# Patient Record
Sex: Female | Born: 1985 | Race: White | Hispanic: No | Marital: Married | Smoking: Never smoker
Health system: Southern US, Community
[De-identification: ages and names within clinical notes are randomized; demographics above are authoritative.]

## PROBLEM LIST (undated history)

## (undated) DIAGNOSIS — K8020 Calculus of gallbladder without cholecystitis without obstruction: Secondary | ICD-10-CM

## (undated) DIAGNOSIS — E88819 Insulin resistance, unspecified: Secondary | ICD-10-CM

## (undated) DIAGNOSIS — F509 Eating disorder, unspecified: Secondary | ICD-10-CM

## (undated) DIAGNOSIS — F329 Major depressive disorder, single episode, unspecified: Secondary | ICD-10-CM

## (undated) DIAGNOSIS — F32A Depression, unspecified: Secondary | ICD-10-CM

## (undated) HISTORY — DX: Depression, unspecified: F32.A

## (undated) HISTORY — DX: Major depressive disorder, single episode, unspecified: F32.9

## (undated) HISTORY — DX: Insulin resistance, unspecified: E88.819

## (undated) HISTORY — DX: Eating disorder, unspecified: F50.9

## (undated) HISTORY — DX: Calculus of gallbladder without cholecystitis without obstruction: K80.20

---
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
## 2010-10-30 HISTORY — PX: CHOLECYSTECTOMY: SHX55

## 2011-11-17 ENCOUNTER — Ambulatory Visit: Payer: Self-pay | Admitting: Internal Medicine

## 2011-12-01 ENCOUNTER — Ambulatory Visit: Payer: Self-pay | Admitting: General Surgery

## 2011-12-01 LAB — CBC WITH DIFFERENTIAL/PLATELET
Basophil #: 0 10*3/uL (ref 0.0–0.1)
Basophil %: 0.5 %
Eosinophil #: 0.1 10*3/uL (ref 0.0–0.7)
Eosinophil %: 2.2 %
HGB: 13 g/dL (ref 12.0–16.0)
Lymphocyte %: 31.7 %
MCHC: 34 g/dL (ref 32.0–36.0)
MCV: 92 fL (ref 80–100)
Monocyte %: 8.1 %
Neutrophil #: 2.7 10*3/uL (ref 1.4–6.5)
Neutrophil %: 57.5 %
RBC: 4.18 10*6/uL (ref 3.80–5.20)
RDW: 12.7 % (ref 11.5–14.5)
WBC: 4.7 10*3/uL (ref 3.6–11.0)

## 2011-12-01 LAB — HEPATIC FUNCTION PANEL A (ARMC)
SGOT(AST): 27 U/L (ref 15–37)
SGPT (ALT): 34 U/L

## 2011-12-05 ENCOUNTER — Ambulatory Visit: Payer: Self-pay | Admitting: General Surgery

## 2015-02-21 NOTE — Op Note (Signed)
PATIENT NAME:  Katie Thomas, Katie Thomas MR#:  [id number] DATE OF BIRTH:  17-Aug-1986  DATE OF PROCEDURE:  12/05/2011  PREOPERATIVE DIAGNOSIS: Cholelithiasis, chronic cholecystitis.   POSTOPERATIVE DIAGNOSIS: Cholelithiasis, chronic cholecystitis.   OPERATION: Laparoscopy, cholecystectomy with cholangiogram.   SURGEON: Kathreen CosierS. G. Sankar, MD    ANESTHESIA: General.   COMPLICATIONS: None.   ESTIMATED BLOOD LOSS: Less than 25 mL.   DRAINS: None.   PROCEDURE: The patient was put to sleep in the supine position on the operating table. The abdomen was prepped and draped out as a sterile field. A small incision was made in the upper lip of the umbilicus and the fascia was pulled up and the Veress needle with the InnerDyne sleeve was positioned in the peritoneal cavity and verified with the hanging drop method. Pneumoperitoneum was obtained and a 10 mm port was placed. Camera was introduced with good visualization of the peritoneal cavity. Epigastric and two lateral 5 mm ports were placed. The liver appeared normal and the adjoining bowel and transverse colon, duodenum, stomach were all noted to be normal. There were adhesions surrounding the lower posterior half of the gallbladder going towards the Hartmann's pouch. With careful exposure, the adhesions were taken down to reveal the Hartmann's pouch in the cystic duct area. Further dissection was performed and the cystic duct was isolated. A Kumar clamp and catheter were positioned. Cholangiogram was performed which showed preferential filling of the gallbladder with evidence of tiny stones occupying the cystic duct which were obstructing and did not allow the dilator to get into the common bile duct. In view of this, the Kumar catheter was used to decompress the gallbladder. It was then removed. The cystic duct was proximally hemoclipped and a small opening was made. The cystic duct was then milked upward from the common duct area with multiple small stones removed.  Following this, there appeared to be free flow of bile coming through. A Reddick catheter was then utilized and cholangiogram was performed which showed normal  sized common bile duct and proximal radicles. No definite filling defects were identified and there did not appear to be any obstruction to flow. The catheter was removed. The cystic duct was hemoclipped and cut. The cystic artery was identified, freed, hemoclipped, and cut. The gallbladder was dissected free from its bed using cautery for control of bleeding. A small amount of fluid was used to irrigate out the right upper quadrant and all fluid suctioned out. A 5 mm scope was then utilized through the epigastric port site. The gallbladder was placed in an EndoCatch device and brought out through the umbilical port site. It was subsequently opened showing multiple tiny stones of 2 to 3 mm size. The fascial opening in the umbilicus appeared to be relatively small. It was really hard to feel and was, therefore, left alone. The remaining ports were removed after release of pneumoperitoneum. The skin incisions were closed with subcuticular 4-0 Vicryl reinforced with Steri-Strips. Dry sterile dressing was placed. The patient tolerated the procedure well. No immediate problems were encountered. She was subsequently extubated and returned to the recovery room in stable condition.   ____________________________ S.Wynona LunaG. Sankar, MD sgs:drc D: 12/05/2011 09:36:41 ET T: 12/05/2011 10:28:32 ET JOB#: [id number]  cc: Timoteo ExposeS.G. Evette CristalSankar, MD, <Dictator> [facility] Wynona LunaG SANKAR MD ELECTRONICALLY SIGNED 12/05/2011 13:51

## 2016-02-16 ENCOUNTER — Encounter: Payer: Self-pay | Admitting: Family Medicine

## 2016-02-16 ENCOUNTER — Ambulatory Visit (INDEPENDENT_AMBULATORY_CARE_PROVIDER_SITE_OTHER): Payer: BLUE CROSS/BLUE SHIELD | Admitting: Family Medicine

## 2016-02-16 VITALS — BP 112/74 | HR 88 | Temp 98.7°F | Ht 65.0 in | Wt 182.8 lb

## 2016-02-16 DIAGNOSIS — F329 Major depressive disorder, single episode, unspecified: Secondary | ICD-10-CM

## 2016-02-16 DIAGNOSIS — M546 Pain in thoracic spine: Secondary | ICD-10-CM | POA: Diagnosis not present

## 2016-02-16 DIAGNOSIS — R1030 Lower abdominal pain, unspecified: Secondary | ICD-10-CM

## 2016-02-16 DIAGNOSIS — F419 Anxiety disorder, unspecified: Secondary | ICD-10-CM

## 2016-02-16 DIAGNOSIS — F32A Depression, unspecified: Secondary | ICD-10-CM

## 2016-02-16 DIAGNOSIS — F418 Other specified anxiety disorders: Secondary | ICD-10-CM

## 2016-02-16 NOTE — Progress Notes (Signed)
Patient ID: Katie Thomas, female   DOB: [birth year], 30 y.o.   MRN: [id number]  Marikay Alar, MD Phone: [phone]  Katie Thomas is a 30 y.o. female who presents today for new patient visit.  Depression and anxiety: Patient notes in the past she's had depression and anxiety. Seems seasonal. Had been on medication though has been off of this for some time. Right now she is fine if she does not think about the depression and anxiety. Her anxiety is described as feeling on edge and wanting to scream or break something. She does note occasional thoughts of being better off not being alive though no intent or plan to harm herself. No prior attempt to harm herself. No HI.  Abdominal pain: She is followed by gynecology for this. She notes one time a month she develops a sudden intense sharp discomfort in her pelvis. This is not typically around her menstrual cycle. She does note some dysuria and urinary frequency and urgency intermittently as well although nothing consistent. None at this time. No diarrhea, vomiting, or nausea. Has had pregnancy test to evaluate this previously that was most recently negative. She had an IUD placed in January. She has periods every month. Notes the discomfort has been going on intermittently for the last 1-2 years. She's not had any abdominal pain since last month.  Thoracic back pain: Patient notes this has been occurring in her thoracic back in the area of the rhomboids at the right edge of her shoulder blade. Notes this only occurs after she has carried something for at least 30 minutes. Has been going on for the last 1.5 years. No radiation to her arms. No numbness or weakness. She has tried some exercises and Advil that have been some benefit. It has been several weeks since she last had back pain.  Active Ambulatory Problems    Diagnosis Date Noted  . Thoracic back pain 02/19/2016  . Anxiety and depression 02/19/2016  . Abdominal pain 02/19/2016   Resolved  Ambulatory Problems    Diagnosis Date Noted  . No Resolved Ambulatory Problems   Past Medical History  Diagnosis Date  . Depression   . Eating disorder   . Gallstones     Family History  Problem Relation Age of Onset  . Cancer    . Breast cancer    . Heart disease    . Mental illness    . Diabetes      Social History   Social History  . Marital Status: Single    Spouse Name: N/A  . Number of Children: N/A  . Years of Education: N/A   Occupational History  . Not on file.   Social History Main Topics  . Smoking status: Never Smoker   . Smokeless tobacco: Not on file  . Alcohol Use: 0.0 oz/week    0 Standard drinks or equivalent per week     Comment: 1 drink a month  . Drug Use: No  . Sexual Activity: Not on file   Other Topics Concern  . Not on file   Social History Narrative    ROS  General:  Negative for nexplained weight loss, fever Skin: Negative for new or changing mole, sore that won't heal HEENT: Negative for trouble hearing, trouble seeing, ringing in ears, mouth sores, hoarseness, change in voice, dysphagia. CV:  Negative for chest pain, dyspnea, edema, palpitations Resp: Negative for cough, dyspnea, hemoptysis GI: Positive for abdominal pain, Negative for nausea, vomiting, diarrhea, constipation, melena, hematochezia. GU:  Negative for dysuria, incontinence, urinary hesitance, hematuria, vaginal or penile discharge, polyuria, sexual difficulty, lumps in testicle or breasts MSK: Positive for muscle cramps or aches, negative for joint pain or swelling Neuro: Negative for headaches, weakness, numbness, dizziness, passing out/fainting Psych: Positive for depression, anxiety, memory problems  Objective  Physical Exam Filed Vitals:   02/16/16 0933  BP: 112/74  Pulse: 88  Temp: 98.7 F (37.1 C)    BP Readings from Last 3 Encounters:  02/16/16 112/74   Wt Readings from Last 3 Encounters:  02/16/16 182 lb 12.8 oz (82.918 kg)    Physical Exam    Constitutional: She is well-developed, well-nourished, and in no distress.  HENT:  Head: Normocephalic and atraumatic.  Right Ear: External ear normal.  Left Ear: External ear normal.  Mouth/Throat: Oropharynx is clear and moist. No oropharyngeal exudate.  Eyes: Conjunctivae are normal. Pupils are equal, round, and reactive to light.  Neck: Neck supple.  Cardiovascular: Normal rate, regular rhythm and normal heart sounds.  Exam reveals no gallop and no friction rub.   No murmur heard. Pulmonary/Chest: Effort normal and breath sounds normal. No respiratory distress. She has no wheezes. She has no rales.  Abdominal: Soft. Bowel sounds are normal. She exhibits no distension. There is no tenderness. There is no rebound and no guarding.  Musculoskeletal: She exhibits no edema.  No midline spine tenderness, no midline spine step-off, no muscular back tenderness, no muscular back swelling  Lymphadenopathy:    She has no cervical adenopathy.  Neurological: She is alert. Gait normal.  5/5 strength in bilateral biceps, triceps, grip, quads, hamstrings, plantar and dorsiflexion, sensation to light touch intact in bilateral UE and LE, normal gait, 2+ patellar reflexes  Skin: Skin is warm and dry. She is not diaphoretic.  Psychiatric:  Mood depressed and anxious, affect mildly anxious     Assessment/Plan:   Thoracic back pain Suspect possible strain of rhomboids leading to discomfort. Less likely felt to be a spinal column issue. She is neurologically intact in her upper and lower extremities. Discussed heat over the area of discomfort and ice when this occurs. Discussed anti-inflammatories. Advised on back exercises. She will monitor. If persists would consider further evaluation with imaging. Given return precautions.  Anxiety and depression Patient with symptoms of anxiety and depression. She does have some SI though has no intent or plan to harm herself. Discussed potential treatment methods  including medications and therapy. Patient wanted to hold off on medications at this time. Referral to psychology placed. Given return precautions.  Abdominal pain Patient with chronic intermittent lower abdominal discomfort that has been followed by gynecology to this point. Not associated with anything in particular. She has no pain at this time. She has a benign abdominal exam. Potentially could be related to ovulation. Discussed that she would likely benefit from pelvic ultrasound and abdominal ultrasound to evaluate further. She noted she would like to discuss this with her gynecologist first. Offered pregnancy test the patient declined at this time. She will monitor and try to know when this occurs every month. We'll continue to monitor. She is given return precautions.    Orders Placed This Encounter  Procedures  . Ambulatory referral to Psychology    Referral Priority:  Routine    Referral Type:  Psychiatric    Referral Reason:  Specialty Services Required    Requested Specialty:  Psychology    Number of Visits Requested:  1    Marikay AlarEric Cadence Haslam, MD [facility]  [employer]

## 2016-02-16 NOTE — Patient Instructions (Addendum)
Nice to meet you. We're going to refer you to a therapist. If you do not hear back about this referral in the next week please let us know. Please follow-up with her gynecologist regarding her pelvic discomfort. Your back discomfort is likely related to muscular strain given the location. Please do the exercises listed below for your back.  you can also use ibuprofen over-the-counter. if you have worsening or persistent pain please let us know so we can follow-up on this. If you develop persistent abdominal pain, diarrhea, nausea, vomiting, burning with urination, worsening depression or anxiety, thoughts of harming herself or others and worsening back pain, fevers, numbness, weakness, loss of bowel or bladder function, numbness between her legs, or any new or changing symptoms please seek medical attention.  Back Exercises If you have pain in your back, do these exercises 2-3 times each day or as told by your doctor. When the pain goes away, do the exercises once each day, but repeat the steps more times for each exercise (do more repetitions). If you do not have pain in your back, do these exercises once each day or as told by your doctor. EXERCISES Single Knee to Chest Do these steps 3-5 times in a row for each leg: 1. Lie on your back on a firm bed or the floor with your legs stretched out. 2. Bring one knee to your chest. 3. Hold your knee to your chest by grabbing your knee or thigh. 4. Pull on your knee until you feel a gentle stretch in your lower back. 5. Keep doing the stretch for 10-30 seconds. 6. Slowly let go of your leg and straighten it. Pelvic Tilt Do these steps 5-10 times in a row: 1. Lie on your back on a firm bed or the floor with your legs stretched out. 2. Bend your knees so they point up to the ceiling. Your feet should be flat on the floor. 3. Tighten your lower belly (abdomen) muscles to press your lower back against the floor. This will make your tailbone point up to  the ceiling instead of pointing down to your feet or the floor. 4. Stay in this position for 5-10 seconds while you gently tighten your muscles and breathe evenly. Cat-Cow Do these steps until your lower back bends more easily: 1. Get on your hands and knees on a firm surface. Keep your hands under your shoulders, and keep your knees under your hips. You may put padding under your knees. 2. Let your head hang down, and make your tailbone point down to the floor so your lower back is round like the back of a cat. 3. Stay in this position for 5 seconds. 4. Slowly lift your head and make your tailbone point up to the ceiling so your back hangs low (sags) like the back of a cow. 5. Stay in this position for 5 seconds. Press-Ups Do these steps 5-10 times in a row: 1. Lie on your belly (face-down) on the floor. 2. Place your hands near your head, about shoulder-width apart. 3. While you keep your back relaxed and keep your hips on the floor, slowly straighten your arms to raise the top half of your body and lift your shoulders. Do not use your back muscles. To make yourself more comfortable, you may change where you place your hands. 4. Stay in this position for 5 seconds. 5. Slowly return to lying flat on the floor. Bridges Do these steps 10 times in a row: 1. Lorenz CoasterLie  on your back on a firm surface. 2. Bend your knees so they point up to the ceiling. Your feet should be flat on the floor. 3. Tighten your butt muscles and lift your butt off of the floor until your waist is almost as high as your knees. If you do not feel the muscles working in your butt and the back of your thighs, slide your feet 1-2 inches farther away from your butt. 4. Stay in this position for 3-5 seconds. 5. Slowly lower your butt to the floor, and let your butt muscles relax. If this exercise is too easy, try doing it with your arms crossed over your chest. Belly Crunches Do these steps 5-10 times in a row: 1. Lie on your back  on a firm bed or the floor with your legs stretched out. 2. Bend your knees so they point up to the ceiling. Your feet should be flat on the floor. 3. Cross your arms over your chest. 4. Tip your chin a little bit toward your chest but do not bend your neck. 5. Tighten your belly muscles and slowly raise your chest just enough to lift your shoulder blades a tiny bit off of the floor. 6. Slowly lower your chest and your head to the floor. Back Lifts Do these steps 5-10 times in a row: 1. Lie on your belly (face-down) with your arms at your sides, and rest your forehead on the floor. 2. Tighten the muscles in your legs and your butt. 3. Slowly lift your chest off of the floor while you keep your hips on the floor. Keep the back of your head in line with the curve in your back. Look at the floor while you do this. 4. Stay in this position for 3-5 seconds. 5. Slowly lower your chest and your face to the floor. GET HELP IF:  Your back pain gets a lot worse when you do an exercise.  Your back pain does not lessen 2 hours after you exercise. If you have any of these problems, stop doing the exercises. Do not do them again unless your doctor says it is okay. GET HELP RIGHT AWAY IF:  You have sudden, very bad back pain. If this happens, stop doing the exercises. Do not do them again unless your doctor says it is okay.   This information is not intended to replace advice given to you by your health care provider. Make sure you discuss any questions you have with your health care provider.   Document Released: 11/18/2010 Document Revised: 07/07/2015 Document Reviewed: 12/10/2014 Elsevier Interactive Patient Education [employer]

## 2016-02-16 NOTE — Progress Notes (Signed)
Pre visit review using our clinic review tool, if applicable. No additional management support is needed unless otherwise documented below in the visit note. 

## 2016-02-19 ENCOUNTER — Encounter: Payer: Self-pay | Admitting: Family Medicine

## 2016-02-19 DIAGNOSIS — R109 Unspecified abdominal pain: Secondary | ICD-10-CM | POA: Insufficient documentation

## 2016-02-19 DIAGNOSIS — F419 Anxiety disorder, unspecified: Secondary | ICD-10-CM | POA: Insufficient documentation

## 2016-02-19 DIAGNOSIS — F329 Major depressive disorder, single episode, unspecified: Secondary | ICD-10-CM | POA: Insufficient documentation

## 2016-02-19 DIAGNOSIS — M546 Pain in thoracic spine: Secondary | ICD-10-CM | POA: Insufficient documentation

## 2016-02-19 DIAGNOSIS — F32A Depression, unspecified: Secondary | ICD-10-CM | POA: Insufficient documentation

## 2016-02-19 NOTE — Assessment & Plan Note (Addendum)
Patient with chronic intermittent lower abdominal discomfort that has been followed by gynecology to this point. Not associated with anything in particular. She has no pain at this time. She has a benign abdominal exam. Potentially could be related to ovulation. Discussed that she would likely benefit from pelvic ultrasound and abdominal ultrasound to evaluate further. She noted she would like to discuss this with her gynecologist first. Offered pregnancy test the patient declined at this time. She will monitor and try to know when this occurs every month. We'll continue to monitor. She is given return precautions.

## 2016-02-19 NOTE — Assessment & Plan Note (Signed)
Patient with symptoms of anxiety and depression. She does have some SI though has no intent or plan to harm herself. Discussed potential treatment methods including medications and therapy. Patient wanted to hold off on medications at this time. Referral to psychology placed. Given return precautions.

## 2016-02-19 NOTE — Assessment & Plan Note (Signed)
Suspect possible strain of rhomboids leading to discomfort. Less likely felt to be a spinal column issue. She is neurologically intact in her upper and lower extremities. Discussed heat over the area of discomfort and ice when this occurs. Discussed anti-inflammatories. Advised on back exercises. She will monitor. If persists would consider further evaluation with imaging. Given return precautions.

## 2016-03-15 ENCOUNTER — Ambulatory Visit: Payer: BLUE CROSS/BLUE SHIELD | Admitting: Psychology

## 2016-03-17 ENCOUNTER — Ambulatory Visit: Payer: BLUE CROSS/BLUE SHIELD | Admitting: Family Medicine

## 2016-07-31 ENCOUNTER — Ambulatory Visit
Admission: RE | Admit: 2016-07-31 | Discharge: 2016-07-31 | Disposition: A | Discharge status: Home / Self Care | Payer: BLUE CROSS/BLUE SHIELD | Source: Ambulatory Visit | Attending: Occupational Medicine | Admitting: Occupational Medicine

## 2016-07-31 ENCOUNTER — Other Ambulatory Visit: Payer: Self-pay | Admitting: Occupational Medicine

## 2016-07-31 DIAGNOSIS — R7611 Nonspecific reaction to tuberculin skin test without active tuberculosis: Secondary | ICD-10-CM

## 2016-09-28 DIAGNOSIS — Z975 Presence of (intrauterine) contraceptive device: Secondary | ICD-10-CM | POA: Diagnosis not present

## 2016-09-28 DIAGNOSIS — K921 Melena: Secondary | ICD-10-CM | POA: Diagnosis not present

## 2016-09-28 DIAGNOSIS — R102 Pelvic and perineal pain: Secondary | ICD-10-CM | POA: Diagnosis not present

## 2016-09-28 DIAGNOSIS — N946 Dysmenorrhea, unspecified: Secondary | ICD-10-CM | POA: Diagnosis not present

## 2016-09-28 DIAGNOSIS — N939 Abnormal uterine and vaginal bleeding, unspecified: Secondary | ICD-10-CM | POA: Diagnosis not present

## 2016-09-28 DIAGNOSIS — Z8041 Family history of malignant neoplasm of ovary: Secondary | ICD-10-CM | POA: Diagnosis not present

## 2016-10-11 DIAGNOSIS — R10813 Right lower quadrant abdominal tenderness: Secondary | ICD-10-CM | POA: Diagnosis not present

## 2016-10-11 DIAGNOSIS — K625 Hemorrhage of anus and rectum: Secondary | ICD-10-CM | POA: Diagnosis not present

## 2017-01-08 ENCOUNTER — Ambulatory Visit: Payer: Self-pay | Admitting: Physician Assistant

## 2017-01-24 ENCOUNTER — Ambulatory Visit: Payer: Self-pay | Admitting: Physician Assistant

## 2017-12-30 DIAGNOSIS — J069 Acute upper respiratory infection, unspecified: Secondary | ICD-10-CM | POA: Diagnosis not present

## 2017-12-30 DIAGNOSIS — J029 Acute pharyngitis, unspecified: Secondary | ICD-10-CM | POA: Diagnosis not present

## 2018-04-23 ENCOUNTER — Encounter: Payer: Self-pay | Admitting: Family Medicine

## 2018-04-23 ENCOUNTER — Ambulatory Visit: Payer: Self-pay | Admitting: Family Medicine

## 2018-04-23 VITALS — BP 132/70 | HR 77 | Temp 97.9°F | Ht 65.0 in | Wt 187.0 lb

## 2018-04-23 DIAGNOSIS — Z0000 Encounter for general adult medical examination without abnormal findings: Secondary | ICD-10-CM

## 2018-04-23 LAB — POCT URINALYSIS DIPSTICK
BILIRUBIN UA: NEGATIVE
GLUCOSE UA: NEGATIVE
KETONES UA: NEGATIVE
Leukocytes, UA: NEGATIVE
Nitrite, UA: NEGATIVE
Protein, UA: NEGATIVE
SPEC GRAV UA: 1.015 (ref 1.010–1.025)
Urobilinogen, UA: 0.2 E.U./dL
pH, UA: 7.5 (ref 5.0–8.0)

## 2018-04-23 LAB — POCT URINE PREGNANCY: PREG TEST UR: NEGATIVE

## 2018-04-23 NOTE — Patient Instructions (Signed)

## 2018-04-23 NOTE — Progress Notes (Signed)
Subjective:  Katie Thomas is a 32 y.o. female who presents for routine wellness visit which is required to satisfy requirements for her employer sponsored health benefits. Medical history significant for chronic abdominal pain, anxiety/depression, and chronic back pain. She is currently not prescribed any medications. Last saw a primary care provider in 2017, although has seen a gynecologist in 2018. Reports chronic conditions are stable and well-managed with lifestyle choices.  Patient's last menstrual period was 03/23/2018 (approximate). She has IUD in place which is approximately 372-31 years old. Patient denies any current health related concerns.    Past Medical History:  Diagnosis Date  . Depression   . Eating disorder   . Gallstones     Social History   Tobacco Use  . Smoking status: Never Smoker  . Smokeless tobacco: Never Used  Substance Use Topics  . Alcohol use: Yes    Alcohol/week: 0.0 oz    Comment: 1 drink a month  . Drug use: No    No Known Allergies  Current Outpatient Medications  Medication Sig Dispense Refill  . Levonorgestrel (KYLEENA) 19.5 MG IUD Kyleena 17.5 mcg/24 hrs (5615yrs) 19.5mg  intrauterine device  Take 1 device every day by intrauterine route.     No current facility-administered medications for this visit.    Review of Systems  Constitutional: Negative.   Respiratory: Negative.   Cardiovascular: Negative.   Gastrointestinal:       Chronic abdominal pain ( currently controlled)  Genitourinary: Negative.   Musculoskeletal: Negative.   Skin: Negative.   Neurological: Negative.   Endo/Heme/Allergies: Positive for environmental allergies.       Occasional seasonal allergies.   Psychiatric/Behavioral: Negative.        Depression and anxiety (most pronounced in winter months)   Objective:  Physical Exam: Constitutional: Patient appears well-developed and well-nourished. No distress. HENT: Normocephalic, atraumatic, External right and left ear  normal. Oropharynx is clear and moist.  Eyes: Conjunctivae and EOM are normal. PERRLA, no scleral icterus. Neck: Normal ROM. Neck supple. No JVD. No tracheal deviation. No thyromegaly. CVS: RRR, S1/S2 +, no murmurs, no gallops, no carotid bruit.  Pulmonary: Effort and breath sounds normal, no stridor, rhonchi, wheezes, rales.  Abdominal: Soft. BS +, no distension, tenderness, rebound or guarding.  Musculoskeletal: Normal range of motion. No edema and no tenderness.  Neuro: Alert. Normal reflexes, muscle tone coordination. No cranial nerve deficit. Skin: Skin is warm and dry. No rash noted. Not diaphoretic. No erythema. No pallor. Psychiatric: Normal mood and affect. Behavior, judgment, thought content normal. Assessment and Plan:   1. Wellness examination, age-appropriate anticipatory guidance provided.    POCT Urinalysis Dipstick, negative   POCT urine pregnancy, negative  Patient will establish with a PCP and follow-up with chronic conditions.   Godfrey PickKimberly S. Tiburcio PeaHarris, MSN, FNP-C [facility]  [address]  [place], [place] [id number] [phone]

## 2018-10-30 NOTE — L&D Delivery Note (Signed)
Delivery Note At 6:07 PM a viable female was delivered via Vaginal, Spontaneous (Presentation: Left Occiput Anterior).  APGAR: 9, 9; weight pending.   Placenta status: Spontaneous;Pathology, Intact.  Cord: 3 vessels with the following complications: none .  Cord pH: n/a  Anesthesia: Epidural Episiotomy: None Lacerations: 2nd degree, left vaginal wall Suture Repair: 2.0 3.0 vicryl Est. Blood Loss (mL): 441  Mom to postpartum.  Baby to Couplet care / Skin to Skin.  Katie Thomas 10/17/2019, 6:30 PM

## 2019-01-02 DIAGNOSIS — Z30432 Encounter for removal of intrauterine contraceptive device: Secondary | ICD-10-CM | POA: Diagnosis not present

## 2019-01-02 DIAGNOSIS — Z124 Encounter for screening for malignant neoplasm of cervix: Secondary | ICD-10-CM | POA: Diagnosis not present

## 2019-01-02 DIAGNOSIS — N809 Endometriosis, unspecified: Secondary | ICD-10-CM | POA: Diagnosis not present

## 2019-01-02 DIAGNOSIS — Z3009 Encounter for other general counseling and advice on contraception: Secondary | ICD-10-CM | POA: Diagnosis not present

## 2019-01-15 ENCOUNTER — Telehealth: Payer: BLUE CROSS/BLUE SHIELD | Admitting: Family

## 2019-01-15 DIAGNOSIS — J069 Acute upper respiratory infection, unspecified: Secondary | ICD-10-CM

## 2019-01-15 MED ORDER — BENZONATATE 100 MG PO CAPS: 100.0000 mg | ORAL_CAPSULE | Freq: Two times a day (BID) | ORAL | 0 refills | Status: DC | PRN

## 2019-01-15 MED ORDER — FLUTICASONE PROPIONATE 50 MCG/ACT NA SUSP: 2.0000 | Freq: Every day | NASAL | 6 refills | Status: DC

## 2019-01-15 NOTE — Progress Notes (Signed)

## 2019-01-16 NOTE — Progress Notes (Signed)

## 2019-01-24 ENCOUNTER — Telehealth: Payer: BLUE CROSS/BLUE SHIELD | Admitting: Nurse Practitioner

## 2019-01-24 DIAGNOSIS — R6889 Other general symptoms and signs: Principal | ICD-10-CM

## 2019-01-24 DIAGNOSIS — Z3200 Encounter for pregnancy test, result unknown: Secondary | ICD-10-CM

## 2019-01-24 DIAGNOSIS — Z20822 Contact with and (suspected) exposure to covid-19: Secondary | ICD-10-CM

## 2019-01-24 NOTE — Progress Notes (Signed)
  E-Visit for [employer] Virus Screening  Based on what you have shared with me, you need to seek an evaluation for a severe illness that is causing your symptoms which may be coronavirus or some other illness. I recommend that you be seen and evaluated "face to face". Our Emergency Departments are best equipped to handle patients with severe symptoms.  * I feelthyta your symptoms are mild for covid 19 and the only reason I recommend ace to face visit is because you may be pregnant   I recommend the following:  . If you are having a true medical emergency please call 911. . If you are considered high risk for Corona virus because of a known exposure, fever, shortness of breath and cough, OR if you have severe symptoms of any kind, seek medical care at an emergency room.  . Please call ahead and tell them that you were seen by telemedicine and they have recommended that you have a face to face evaluation. [facility] Department [address] [place], [place], [place] [address] [phone]  . [facility] Emergency Department [address] [place] [address] [phone]  . [facility] Emergency Department [address] [place], [place], [place] [address] [phone]  . [facility] Emergency Department [address] [place], [place], [place] [address] [phone]  . [facility] Emergency Department [address] [place], [place], [place] [address] [phone]  NOTE: If you entered your credit card information for this eVisit, you will not be charged. You may see a "hold" on your card for the $35 but that hold will drop off and you will not have a charge processed.   Your e-visit answers were reviewed by a board certified advanced clinical practitioner to complete your personal care plan.  Thank you for using e-Visits.  5 minutes spent reviewing and documenting in chart.

## 2019-02-20 DIAGNOSIS — Z3490 Encounter for supervision of normal pregnancy, unspecified, unspecified trimester: Secondary | ICD-10-CM | POA: Diagnosis not present

## 2019-02-20 DIAGNOSIS — Z3481 Encounter for supervision of other normal pregnancy, first trimester: Secondary | ICD-10-CM | POA: Diagnosis not present

## 2019-02-20 DIAGNOSIS — Z3201 Encounter for pregnancy test, result positive: Secondary | ICD-10-CM | POA: Diagnosis not present

## 2019-03-08 NOTE — Progress Notes (Signed)
Greater than 5 minutes, yet less than 10 minutes of time have been spent researching, coordinating, and implementing care for this patient today.  Thank you for the details you included in the comment boxes. Those details are very helpful in determining the best course of treatment for you and help us to provide the best care.  

## 2019-03-13 LAB — OB RESULTS CONSOLE GC/CHLAMYDIA
Chlamydia: NEGATIVE
Gonorrhea: NEGATIVE

## 2019-03-13 LAB — OB RESULTS CONSOLE HEPATITIS B SURFACE ANTIGEN: Hepatitis B Surface Ag: NEGATIVE

## 2019-03-13 LAB — OB RESULTS CONSOLE RUBELLA ANTIBODY, IGM: Rubella: IMMUNE

## 2019-03-13 LAB — OB RESULTS CONSOLE ANTIBODY SCREEN: Antibody Screen: NEGATIVE

## 2019-03-13 LAB — OB RESULTS CONSOLE RPR: RPR: NONREACTIVE

## 2019-03-13 LAB — OB RESULTS CONSOLE ABO/RH: RH Type: POSITIVE

## 2019-03-13 LAB — OB RESULTS CONSOLE HIV ANTIBODY (ROUTINE TESTING): HIV: NONREACTIVE

## 2019-03-14 DIAGNOSIS — Z3481 Encounter for supervision of other normal pregnancy, first trimester: Secondary | ICD-10-CM | POA: Diagnosis not present

## 2019-05-08 DIAGNOSIS — Z3480 Encounter for supervision of other normal pregnancy, unspecified trimester: Secondary | ICD-10-CM | POA: Diagnosis not present

## 2019-05-08 DIAGNOSIS — Z3482 Encounter for supervision of other normal pregnancy, second trimester: Secondary | ICD-10-CM | POA: Diagnosis not present

## 2019-05-14 DIAGNOSIS — Z36 Encounter for antenatal screening for chromosomal anomalies: Secondary | ICD-10-CM | POA: Diagnosis not present

## 2019-05-14 DIAGNOSIS — Z3482 Encounter for supervision of other normal pregnancy, second trimester: Secondary | ICD-10-CM | POA: Diagnosis not present

## 2019-05-14 DIAGNOSIS — Z3A18 18 weeks gestation of pregnancy: Secondary | ICD-10-CM | POA: Diagnosis not present

## 2019-07-01 DIAGNOSIS — L299 Pruritus, unspecified: Secondary | ICD-10-CM | POA: Diagnosis not present

## 2019-07-01 DIAGNOSIS — Z3483 Encounter for supervision of other normal pregnancy, third trimester: Secondary | ICD-10-CM | POA: Diagnosis not present

## 2019-07-29 DIAGNOSIS — Z23 Encounter for immunization: Secondary | ICD-10-CM | POA: Diagnosis not present

## 2019-07-29 DIAGNOSIS — Z3482 Encounter for supervision of other normal pregnancy, second trimester: Secondary | ICD-10-CM | POA: Diagnosis not present

## 2019-07-30 DIAGNOSIS — Z3482 Encounter for supervision of other normal pregnancy, second trimester: Secondary | ICD-10-CM | POA: Diagnosis not present

## 2019-08-12 DIAGNOSIS — Z3483 Encounter for supervision of other normal pregnancy, third trimester: Secondary | ICD-10-CM | POA: Diagnosis not present

## 2019-08-12 DIAGNOSIS — Z23 Encounter for immunization: Secondary | ICD-10-CM | POA: Diagnosis not present

## 2019-09-09 DIAGNOSIS — Z3483 Encounter for supervision of other normal pregnancy, third trimester: Secondary | ICD-10-CM | POA: Diagnosis not present

## 2019-09-17 DIAGNOSIS — O368190 Decreased fetal movements, unspecified trimester, not applicable or unspecified: Secondary | ICD-10-CM | POA: Diagnosis not present

## 2019-10-08 ENCOUNTER — Encounter (HOSPITAL_COMMUNITY): Payer: Self-pay | Admitting: *Deleted

## 2019-10-08 ENCOUNTER — Telehealth (HOSPITAL_COMMUNITY): Payer: Self-pay | Admitting: *Deleted

## 2019-10-08 LAB — OB RESULTS CONSOLE GBS: GBS: NEGATIVE

## 2019-10-08 NOTE — Telephone Encounter (Signed)
Preadmission screen  

## 2019-10-09 ENCOUNTER — Telehealth (HOSPITAL_COMMUNITY): Payer: Self-pay | Admitting: *Deleted

## 2019-10-09 ENCOUNTER — Encounter (HOSPITAL_COMMUNITY): Payer: Self-pay | Admitting: *Deleted

## 2019-10-09 NOTE — Telephone Encounter (Signed)
Preadmission screen  

## 2019-10-15 ENCOUNTER — Other Ambulatory Visit (HOSPITAL_COMMUNITY)
Admission: RE | Admit: 2019-10-15 | Discharge: 2019-10-15 | Disposition: A | Discharge status: Home / Self Care | Payer: 59 | Source: Ambulatory Visit | Attending: Obstetrics & Gynecology | Admitting: Obstetrics & Gynecology

## 2019-10-15 DIAGNOSIS — Z20828 Contact with and (suspected) exposure to other viral communicable diseases: Secondary | ICD-10-CM | POA: Insufficient documentation

## 2019-10-15 DIAGNOSIS — O480 Post-term pregnancy: Secondary | ICD-10-CM | POA: Diagnosis not present

## 2019-10-15 DIAGNOSIS — Z01812 Encounter for preprocedural laboratory examination: Secondary | ICD-10-CM | POA: Insufficient documentation

## 2019-10-15 DIAGNOSIS — Z3A41 41 weeks gestation of pregnancy: Secondary | ICD-10-CM | POA: Diagnosis not present

## 2019-10-15 DIAGNOSIS — E669 Obesity, unspecified: Secondary | ICD-10-CM | POA: Diagnosis not present

## 2019-10-15 DIAGNOSIS — O99214 Obesity complicating childbirth: Secondary | ICD-10-CM | POA: Diagnosis not present

## 2019-10-15 LAB — SARS CORONAVIRUS 2 (TAT 6-24 HRS): SARS Coronavirus 2: NEGATIVE

## 2019-10-16 ENCOUNTER — Encounter (HOSPITAL_COMMUNITY): Payer: Self-pay | Admitting: Obstetrics and Gynecology

## 2019-10-16 ENCOUNTER — Other Ambulatory Visit: Payer: Self-pay

## 2019-10-16 ENCOUNTER — Inpatient Hospital Stay (HOSPITAL_COMMUNITY)
Admission: AD | Admit: 2019-10-16 | Discharge: 2019-10-16 | Disposition: A | Discharge status: Home / Self Care | Payer: 59 | Source: Ambulatory Visit | Attending: Obstetrics and Gynecology | Admitting: Obstetrics and Gynecology

## 2019-10-16 DIAGNOSIS — O479 False labor, unspecified: Secondary | ICD-10-CM | POA: Insufficient documentation

## 2019-10-16 DIAGNOSIS — Z3A00 Weeks of gestation of pregnancy not specified: Secondary | ICD-10-CM | POA: Insufficient documentation

## 2019-10-16 NOTE — H&P (Signed)
HPI: 33 y/o G2P0010 @ [redacted]w[redacted]d estimated gestational age (as dated by LMP c/w 20 week ultrasound) presents for scheduled IOL.   no Leaking of Fluid,   no Vaginal Bleeding,   irregular Uterine Contractions,  + Fetal Movement.  Prenatal care has been provided by Dr. Nelda Marseille  ROS: no HA, no epigastric pain, no visual changes.    Pregnancy c/b -Obesity: BMI 38   Prenatal Transfer Tool  Maternal Diabetes: No Genetic Screening: Normal Maternal Ultrasounds/Referrals: Normal Fetal Ultrasounds or other Referrals:  None Maternal Substance Abuse:  No Significant Maternal Medications:  None Significant Maternal Lab Results: Group B Strep negative   PNL:  GBS neg, Rub Immune, Hep B neg, RPR NR, HIV neg, GC/C neg, glucola:91 Hgb: 12.3 Blood type: O positive, antibody neg  Immunizations: Tdap: 9/29 Flu: 10/13  OBHx: SAB x1 PMHx:  none Meds:  PNV Allergy:  No Known Allergies SurgHx: none SocHx:   no Tobacco, no  EtOH, no Illicit Drugs  O: to be obtained on arrival Exam completed in office Gen. AAOx3, NAD CV.  RRR  No murmur.  Resp. CTAB, no wheeze or crackles. Abd. Gravid,  no tenderness,  no rigidity,  no guarding Extr.  no edema B/L , no calf tenderness, neg Homan's B/L FHT: 145 SVE: ft/soft/-3, vertex   Labs: see orders  A/P:  33 y.o. G2P0010 @ [redacted]w[redacted]d EGA who presents for IOL due to full term pregnancy -FWB:  NICHD Cat I FHTs -Labor: plan for IOL with cytotec -GBS: neg -Pain management: IV or epidural upon request  Janyth Pupa, DO [phone] (cell) [phone] (office)

## 2019-10-16 NOTE — Discharge Instructions (Signed)

## 2019-10-16 NOTE — MAU Note (Signed)
Ctxs since Tues night. [place] since Weds about 1700. Denies LOF. Closed last sve.

## 2019-10-17 ENCOUNTER — Inpatient Hospital Stay (HOSPITAL_COMMUNITY): Payer: 59 | Admitting: Anesthesiology

## 2019-10-17 ENCOUNTER — Encounter (HOSPITAL_COMMUNITY): Payer: Self-pay | Admitting: Obstetrics & Gynecology

## 2019-10-17 ENCOUNTER — Inpatient Hospital Stay (HOSPITAL_COMMUNITY)
Admission: AD | Admit: 2019-10-17 | Discharge: 2019-10-19 | DRG: 807 | Disposition: A | Discharge status: Home / Self Care | Payer: 59 | Attending: Obstetrics & Gynecology | Admitting: Obstetrics & Gynecology

## 2019-10-17 ENCOUNTER — Other Ambulatory Visit: Payer: Self-pay

## 2019-10-17 ENCOUNTER — Inpatient Hospital Stay (HOSPITAL_COMMUNITY): Payer: 59

## 2019-10-17 DIAGNOSIS — O480 Post-term pregnancy: Principal | ICD-10-CM | POA: Diagnosis present

## 2019-10-17 DIAGNOSIS — O99214 Obesity complicating childbirth: Secondary | ICD-10-CM | POA: Diagnosis present

## 2019-10-17 DIAGNOSIS — Z20828 Contact with and (suspected) exposure to other viral communicable diseases: Secondary | ICD-10-CM | POA: Diagnosis present

## 2019-10-17 DIAGNOSIS — E669 Obesity, unspecified: Secondary | ICD-10-CM | POA: Diagnosis present

## 2019-10-17 DIAGNOSIS — Z3A41 41 weeks gestation of pregnancy: Secondary | ICD-10-CM

## 2019-10-17 DIAGNOSIS — Z3A00 Weeks of gestation of pregnancy not specified: Secondary | ICD-10-CM | POA: Diagnosis not present

## 2019-10-17 LAB — CBC
HCT: 38.9 % (ref 36.0–46.0)
Hemoglobin: 13.3 g/dL (ref 12.0–15.0)
MCH: 31.9 pg (ref 26.0–34.0)
MCHC: 34.2 g/dL (ref 30.0–36.0)
MCV: 93.3 fL (ref 80.0–100.0)
Platelets: 197 10*3/uL (ref 150–400)
RBC: 4.17 MIL/uL (ref 3.87–5.11)
RDW: 13.2 % (ref 11.5–15.5)
WBC: 12.2 10*3/uL — ABNORMAL HIGH (ref 4.0–10.5)
nRBC: 0 % (ref 0.0–0.2)

## 2019-10-17 LAB — TYPE AND SCREEN
ABO/RH(D): O POS
Antibody Screen: NEGATIVE

## 2019-10-17 LAB — ABO/RH: ABO/RH(D): O POS

## 2019-10-17 LAB — RPR: RPR Ser Ql: NONREACTIVE

## 2019-10-17 MED ORDER — PHENYLEPHRINE 40 MCG/ML (10ML) SYRINGE FOR IV PUSH (FOR BLOOD PRESSURE SUPPORT)
80.0000 ug | PREFILLED_SYRINGE | INTRAVENOUS | Status: DC | PRN
  Filled 2019-10-17: qty 10

## 2019-10-17 MED ORDER — SIMETHICONE 80 MG PO CHEW: 80.0000 mg | CHEWABLE_TABLET | ORAL | Status: DC | PRN

## 2019-10-17 MED ORDER — TERBUTALINE SULFATE 1 MG/ML IJ SOLN: 0.2500 mg | Freq: Once | INTRAMUSCULAR | Status: DC | PRN

## 2019-10-17 MED ORDER — MISOPROSTOL 25 MCG QUARTER TABLET
25.0000 ug | ORAL_TABLET | ORAL | Status: DC | PRN
  Administered 2019-10-17: 25 ug via VAGINAL
  Filled 2019-10-17: qty 1

## 2019-10-17 MED ORDER — DIPHENHYDRAMINE HCL 50 MG/ML IJ SOLN: 12.5000 mg | INTRAMUSCULAR | Status: DC | PRN

## 2019-10-17 MED ORDER — COCONUT OIL OIL: 1.0000 "application " | TOPICAL_OIL | Status: DC | PRN

## 2019-10-17 MED ORDER — ONDANSETRON HCL 4 MG/2ML IJ SOLN
4.0000 mg | Freq: Four times a day (QID) | INTRAMUSCULAR | Status: DC | PRN
  Administered 2019-10-17: 4 mg via INTRAVENOUS
  Filled 2019-10-17: qty 2

## 2019-10-17 MED ORDER — WITCH HAZEL-GLYCERIN EX PADS
1.0000 "application " | MEDICATED_PAD | CUTANEOUS | Status: DC | PRN
  Administered 2019-10-18: 1 via TOPICAL

## 2019-10-17 MED ORDER — SODIUM CHLORIDE (PF) 0.9 % IJ SOLN
INTRAMUSCULAR | Status: DC | PRN
  Administered 2019-10-17: 12 mL/h via EPIDURAL

## 2019-10-17 MED ORDER — DIPHENHYDRAMINE HCL 25 MG PO CAPS: 25.0000 mg | ORAL_CAPSULE | Freq: Four times a day (QID) | ORAL | Status: DC | PRN

## 2019-10-17 MED ORDER — LACTATED RINGERS IV SOLN: INTRAVENOUS | Status: DC

## 2019-10-17 MED ORDER — BENZOCAINE-MENTHOL 20-0.5 % EX AERO
1.0000 "application " | INHALATION_SPRAY | CUTANEOUS | Status: DC | PRN
  Filled 2019-10-17: qty 56

## 2019-10-17 MED ORDER — OXYTOCIN 40 UNITS IN NORMAL SALINE INFUSION - SIMPLE MED
1.0000 m[IU]/min | INTRAVENOUS | Status: DC
  Administered 2019-10-17: 2 m[IU]/min via INTRAVENOUS

## 2019-10-17 MED ORDER — OXYTOCIN BOLUS FROM INFUSION: 500.0000 mL | Freq: Once | INTRAVENOUS | Status: DC

## 2019-10-17 MED ORDER — SOD CITRATE-CITRIC ACID 500-334 MG/5ML PO SOLN: 30.0000 mL | ORAL | Status: DC | PRN

## 2019-10-17 MED ORDER — ZOLPIDEM TARTRATE 5 MG PO TABS: 5.0000 mg | ORAL_TABLET | Freq: Every evening | ORAL | Status: DC | PRN

## 2019-10-17 MED ORDER — LACTATED RINGERS IV SOLN: 500.0000 mL | Freq: Once | INTRAVENOUS | Status: DC

## 2019-10-17 MED ORDER — SENNOSIDES-DOCUSATE SODIUM 8.6-50 MG PO TABS
2.0000 | ORAL_TABLET | ORAL | Status: DC
  Administered 2019-10-17 – 2019-10-19 (×2): 2 via ORAL
  Filled 2019-10-17 (×2): qty 2

## 2019-10-17 MED ORDER — OXYCODONE-ACETAMINOPHEN 5-325 MG PO TABS: 1.0000 | ORAL_TABLET | ORAL | Status: DC | PRN

## 2019-10-17 MED ORDER — DIBUCAINE (PERIANAL) 1 % EX OINT: 1.0000 "application " | TOPICAL_OINTMENT | CUTANEOUS | Status: DC | PRN

## 2019-10-17 MED ORDER — PHENYLEPHRINE 40 MCG/ML (10ML) SYRINGE FOR IV PUSH (FOR BLOOD PRESSURE SUPPORT)
80.0000 ug | PREFILLED_SYRINGE | INTRAVENOUS | Status: AC | PRN
  Administered 2019-10-17 (×3): 80 ug via INTRAVENOUS
  Filled 2019-10-17: qty 10

## 2019-10-17 MED ORDER — PRENATAL MULTIVITAMIN CH
1.0000 | ORAL_TABLET | Freq: Every day | ORAL | Status: DC
  Administered 2019-10-18 – 2019-10-19 (×2): 1 via ORAL
  Filled 2019-10-17 (×2): qty 1

## 2019-10-17 MED ORDER — LACTATED RINGERS IV SOLN: 500.0000 mL | INTRAVENOUS | Status: DC | PRN

## 2019-10-17 MED ORDER — FENTANYL CITRATE (PF) 100 MCG/2ML IJ SOLN: 50.0000 ug | INTRAMUSCULAR | Status: DC | PRN

## 2019-10-17 MED ORDER — OXYCODONE-ACETAMINOPHEN 5-325 MG PO TABS: 2.0000 | ORAL_TABLET | ORAL | Status: DC | PRN

## 2019-10-17 MED ORDER — FENTANYL-BUPIVACAINE-NACL 0.5-0.125-0.9 MG/250ML-% EP SOLN
12.0000 mL/h | EPIDURAL | Status: DC | PRN
  Filled 2019-10-17: qty 250

## 2019-10-17 MED ORDER — EPHEDRINE 5 MG/ML INJ: 10.0000 mg | INTRAVENOUS | Status: DC | PRN

## 2019-10-17 MED ORDER — ACETAMINOPHEN 325 MG PO TABS: 650.0000 mg | ORAL_TABLET | ORAL | Status: DC | PRN

## 2019-10-17 MED ORDER — IBUPROFEN 600 MG PO TABS
600.0000 mg | ORAL_TABLET | Freq: Four times a day (QID) | ORAL | Status: DC
  Administered 2019-10-17 – 2019-10-19 (×7): 600 mg via ORAL
  Filled 2019-10-17 (×7): qty 1

## 2019-10-17 MED ORDER — LIDOCAINE HCL (PF) 1 % IJ SOLN
30.0000 mL | INTRAMUSCULAR | Status: AC | PRN
  Administered 2019-10-17: 6 mL via SUBCUTANEOUS

## 2019-10-17 MED ORDER — OXYTOCIN 40 UNITS IN NORMAL SALINE INFUSION - SIMPLE MED
2.5000 [IU]/h | INTRAVENOUS | Status: DC
  Filled 2019-10-17: qty 1000

## 2019-10-17 MED ORDER — ACETAMINOPHEN 325 MG PO TABS
650.0000 mg | ORAL_TABLET | ORAL | Status: DC | PRN
  Administered 2019-10-17: 650 mg via ORAL
  Filled 2019-10-17: qty 2

## 2019-10-17 MED ORDER — ONDANSETRON HCL 4 MG/2ML IJ SOLN: 4.0000 mg | INTRAMUSCULAR | Status: DC | PRN

## 2019-10-17 MED ORDER — EPHEDRINE 5 MG/ML INJ
10.0000 mg | INTRAVENOUS | Status: DC | PRN
  Administered 2019-10-17: 10 mg via INTRAVENOUS
  Filled 2019-10-17: qty 10

## 2019-10-17 MED ORDER — ONDANSETRON HCL 4 MG PO TABS: 4.0000 mg | ORAL_TABLET | ORAL | Status: DC | PRN

## 2019-10-17 NOTE — Progress Notes (Signed)
OB PN:  S: Pt resting comfortably with epidural  O: BP 122/81   Pulse 76   Temp 97.6 F (36.4 C) (Oral)   Resp 18   Ht 5\' 4"  (1.626 m)   Wt 102 kg   SpO2 97%   BMI 38.60 kg/m   FHT: 130bpm, moderate variablity, + accels, occasional  Variable decels Toco: irregular SVE: 4-5/80/-2  A/P: 33 y.o. G2P0010 @ [redacted]w[redacted]d for IOL 1. FWB: Cat. II- overall FHT reassuring 2. Labor: Pit per protocol Pain: continue with epidural GBS: negative  Janyth Pupa, DO [phone] (cell) [phone] (office)

## 2019-10-17 NOTE — Anesthesia Preprocedure Evaluation (Signed)
Anesthesia Evaluation  Patient identified by MRN, date of birth, ID band Patient awake    Reviewed: Allergy & Precautions, H&P , NPO status , Patient's Chart, lab work & pertinent test results, reviewed documented beta blocker date and time   Airway Mallampati: I  TM Distance: >3 FB Neck ROM: full    Dental no notable dental hx. (+) Teeth Intact, Dental Advisory Given   Pulmonary neg pulmonary ROS,    Pulmonary exam normal breath sounds clear to auscultation       Cardiovascular negative cardio ROS Normal cardiovascular exam Rhythm:regular Rate:Normal     Neuro/Psych negative neurological ROS  negative psych ROS   GI/Hepatic negative GI ROS, Neg liver ROS,   Endo/Other  negative endocrine ROS  Renal/GU negative Renal ROS  negative genitourinary   Musculoskeletal   Abdominal   Peds  Hematology negative hematology ROS (+)   Anesthesia Other Findings   Reproductive/Obstetrics (+) Pregnancy                             Anesthesia Physical Anesthesia Plan  ASA: III  Anesthesia Plan: Epidural   Post-op Pain Management:    Induction:   PONV Risk Score and Plan:   Airway Management Planned:   Additional Equipment:   Intra-op Plan:   Post-operative Plan:   Informed Consent: I have reviewed the patients History and Physical, chart, labs and discussed the procedure including the risks, benefits and alternatives for the proposed anesthesia with the patient or authorized representative who has indicated his/her understanding and acceptance.       Plan Discussed with:   Anesthesia Plan Comments:         Anesthesia Quick Evaluation

## 2019-10-17 NOTE — Anesthesia Procedure Notes (Signed)
Epidural Patient location during procedure: OB Start time: 10/17/2019 5:06 AM End time: 10/17/2019 5:10 AM  Staffing Anesthesiologist: Janeece Riggers, MD  Preanesthetic Checklist Completed: patient identified, IV checked, site marked, risks and benefits discussed, surgical consent, monitors and equipment checked, pre-op evaluation and timeout performed  Epidural Patient position: sitting Prep: DuraPrep and site prepped and draped Patient monitoring: continuous pulse ox and blood pressure Approach: midline Location: L3-L4 Injection technique: LOR air  Needle:  Needle type: Tuohy  Needle gauge: 17 G Needle length: 9 cm and 9 Needle insertion depth: 8 cm Catheter type: closed end flexible Catheter size: 19 Gauge Catheter at skin depth: 12 cm Test dose: negative  Assessment Events: blood not aspirated, injection not painful, no injection resistance, no paresthesia and negative IV test

## 2019-10-17 NOTE — Progress Notes (Signed)
OB PN:  S: Resting comfortably, feeling occasional cramping  O: BP 123/63   Pulse 100   Temp 97.6 F (36.4 C) (Oral)   Resp 16   Ht 5\' 4"  (1.626 m)   Wt 102 kg   SpO2 97%   BMI 38.60 kg/m   FHT: 130bpm, moderate variablity, + accels, +early decels Toco: q38min SVE: C/C/0  A/P: 33 y.o. G2P0010 @ [redacted]w[redacted]d for IOL 1. FWB: Cat. I 2. Labor: Pit per protocol, plan to start trial of pushing Pain: continue with epidural GBS: negative  Janyth Pupa, DO [phone] (cell) [phone] (office)

## 2019-10-17 NOTE — Progress Notes (Signed)
OB PN:  S: Pt resting comfortably with epidural  O: BP (!) 91/43 (BP Location: Right Arm) Comment: asymptomatic  Pulse 85   Temp 97.8 F (36.6 C) (Oral)   Resp 16   Ht 5\' 4"  (1.626 m)   Wt 102 kg   SpO2 100%   BMI 38.60 kg/m   FHT: 130bpm, moderate variablity, + accels, no decels Toco: irregular SVE: deferred, last exam 2/80/-2  A/P: 33 y.o. G2P0010 @ [redacted]w[redacted]d for IOL 1. FWB: Cat. I 2. Labor: Pit per protocol Pain: continue with epidural GBS: negative  Janyth Pupa, DO [phone] (cell) [phone] (office)

## 2019-10-18 LAB — CBC
HCT: 29.6 % — ABNORMAL LOW (ref 36.0–46.0)
Hemoglobin: 10 g/dL — ABNORMAL LOW (ref 12.0–15.0)
MCH: 32.6 pg (ref 26.0–34.0)
MCHC: 33.8 g/dL (ref 30.0–36.0)
MCV: 96.4 fL (ref 80.0–100.0)
Platelets: 168 10*3/uL (ref 150–400)
RBC: 3.07 MIL/uL — ABNORMAL LOW (ref 3.87–5.11)
RDW: 13.2 % (ref 11.5–15.5)
WBC: 13.4 10*3/uL — ABNORMAL HIGH (ref 4.0–10.5)
nRBC: 0 % (ref 0.0–0.2)

## 2019-10-18 MED ORDER — IBUPROFEN 600 MG PO TABS: 600.0000 mg | ORAL_TABLET | Freq: Four times a day (QID) | ORAL | 0 refills | Status: DC

## 2019-10-18 NOTE — Discharge Instructions (Signed)

## 2019-10-18 NOTE — Lactation Note (Signed)
This note was copied from a baby's chart. Lactation Consultation Note  Patient Name: Girl Jimena Wieczorek [id number] Date: 10/18/2019 Reason for consult: Follow-up assessment;Term  LC Follow Up Visit:  Mother had a few questions related to latching, however, baby was not showing cues at this time and had recently fed.  Mother's nipples are somewhat sore and she does not feel like baby latches well.  I asked her to call me back when baby shows cues and I would assist with latching and discuss the plan for sore nipples.  Mother appreciative and would enjoy my return visit.  RN updated.   Maternal Data    Feeding Feeding Type: Breast Fed  LATCH Score Latch: Grasps breast easily, tongue down, lips flanged, rhythmical sucking.  Audible Swallowing: Spontaneous and intermittent  Type of Nipple: Everted at rest and after stimulation  Comfort (Breast/Nipple): Soft / non-tender  Hold (Positioning): Assistance needed to correctly position infant at breast and maintain latch.  LATCH Score: 9  Interventions    Lactation Tools Discussed/Used     Consult Status Consult Status: Follow-up Date: 10/18/19 Follow-up type: In-patient    Felipe Paluch R Josey Forcier 10/18/2019, 10:52 AM

## 2019-10-18 NOTE — Progress Notes (Signed)
Postpartum Note Day # 1  S:  Patient resting comfortable in bed.  Pain controlled.  Tolerating general diet. + flatus, no BM.  Lochia moderate.  Ambulating without difficulty.  She denies n/v/f/c, SOB, or CP.  Pt plans on breastfeeding.  O: Temp:  [97.5 F (36.4 C)-98.3 F (36.8 C)] 97.5 F (36.4 C) (12/19 0549) Pulse Rate:  [69-133] 90 (12/19 0549) Resp:  [16-19] 18 (12/19 0549) BP: (113-140)/(51-93) 117/77 (12/19 0549) SpO2:  [99 %-100 %] 100 % (12/19 0549) Gen: A&Ox3, NAD CV: RRR, no MRG Resp: CTAB Abdomen: soft, NT, ND Uterus: firm, non-tender, below umbilicus Ext: No edema, no calf tenderness bilaterally, SCDs in place  Labs:  Recent Labs    10/17/19 0030 10/18/19 0540  HGB 13.3 10.0*    A/P: Pt is a 33 y.o. G2P1011 s/p NSVD, PPD#1  - Pain well controlled -GU: UOP is adequate -GI: Tolerating general diet -Activity: encouraged sitting up to chair and ambulation as tolerated -Prophylaxis: early ambulation -Labs: stable as above  DISPO: Pending baby's discharge, would consider early discharge home later this evening  Janyth Pupa, DO [phone] (cell) [phone] (office)

## 2019-10-18 NOTE — Lactation Note (Signed)
This note was copied from a baby's chart. Lactation Consultation Note Baby 37 hrs old.  Mom stated nipples are sore. Nipples flat red, bruised, and sore. Mom states that the baby has a very strong suck that hurts at times. Mom states that sometimes if the baby is on good, it doesn't hurt if she's on right. But a lot of times she isn't. Breast tissue very compressible and soft. Mom has wide spaced breast at least 3 fingers width space.  Mom has coconut oil at bedside. Encouraged mom to use it. LC assisted baby to breast in football hold.  LC used t-cup hold to get breast tissue deep into baby's mouth. Chin tug to widen flange. Baby tongue thrust some pushing nipple out then wanting to suckle on tip of nipple.  Discussed importance of not letting baby suckle on nipple if she doesn't have a good latch. demonstrated breaking wrong painful latch.  Baby wasn't eager to feed. Sleepy. Removed and swaddled, baby woke up. Gave baby to mom to hold. Mom encouraged to feed baby 8-12 times/24 hours and with feeding cues. Call for assistance when baby is interested in feeding. Newborn behavior, STS, I&O, positioning, support,cueing, supply and demand discussed. Lactation brochure given. Mentioned to mom if nipples get worse mom may need NS. Mom has hand pump for pre-pumping prior to latching.  Patient Name: Katie Thomas [id number] Date: 10/18/2019 Reason for consult: Initial assessment;Primapara;Term   Maternal Data Has patient been taught Hand Expression?: Yes Does the patient have breastfeeding experience prior to this delivery?: No  Feeding Feeding Type: Breast Fed  LATCH Score Latch: Too sleepy or reluctant, no latch achieved, no sucking elicited.  Audible Swallowing: None  Type of Nipple: Flat(semi flat compressible)  Comfort (Breast/Nipple): Filling, red/small blisters or bruises, mild/mod discomfort  Hold (Positioning): Full assist, staff holds infant at breast  LATCH Score:  2  Interventions Interventions: Breast feeding basics reviewed;Support pillows;Assisted with latch;Position options;Skin to skin;Breast massage;Hand express;Adjust position;Breast compression;Hand pump;Coconut oil  Lactation Tools Discussed/Used Tools: Pump Breast pump type: Manual   Consult Status Consult Status: Follow-up Date: 10/18/19 Follow-up type: In-patient    Theodoro Kalata 10/18/2019, 5:34 AM

## 2019-10-18 NOTE — Anesthesia Postprocedure Evaluation (Signed)
Anesthesia Post Note  Patient: Katie Thomas  Procedure(s) Performed: AN AD HOC LABOR EPIDURAL     Patient location during evaluation: Mother Baby Anesthesia Type: Epidural Level of consciousness: awake and alert Pain management: pain level controlled Vital Signs Assessment: post-procedure vital signs reviewed and stable Respiratory status: spontaneous breathing, nonlabored ventilation and respiratory function stable Cardiovascular status: stable Postop Assessment: no headache, no backache and epidural receding Anesthetic complications: no    Last Vitals:  Vitals:   10/18/19 0200 10/18/19 0549  BP: 129/82 117/77  Pulse: 90 90  Resp: 19 18  Temp: 36.5 C (!) 36.4 C  SpO2: 99% 100%    Last Pain:  Vitals:   10/18/19 0757  TempSrc:   PainSc: Asleep   Pain Goal:                   Drucie Opitz

## 2019-10-18 NOTE — Lactation Note (Signed)
This note was copied from a baby's chart. Lactation Consultation Note  Patient Name: Katie Thomas [id number] Date: 10/18/2019 Reason for consult: Follow-up assessment;Term;Primapara;1st time breastfeeding  P1 mother whose infant is now 15 hours old.  Mother requested latch assistance.  Assisted mother to position appropriately with good pillow support.  Asked her to hand express colostrum drops but she was unable to produce any drops at this time.  Mother's breasts are soft and non tender and nipples are short shafted and everted.  Mother's left nipple is bruised from a previous poor latch.  Suggested she use EBM/coconut oil for comfort.  Mother has coconut oil at bedside.  Assisted baby to latch easily to the left breast in the football hold.  She immediately began rhythmic sucking and no stimulation needed.  Mother is sensitive but denies pain.  Observed baby feeding for 5 minutes while discussing breast feeding basics.  Mother appreciative and will continue feeding until baby tires.  She will call her RN/LC for latch assistance as needed.  RN updated.   Maternal Data Formula Feeding for Exclusion: No Has patient been taught Hand Expression?: Yes Does the patient have breastfeeding experience prior to this delivery?: No  Feeding Feeding Type: Breast Fed  LATCH Score Latch: Grasps breast easily, tongue down, lips flanged, rhythmical sucking.  Audible Swallowing: None  Type of Nipple: Everted at rest and after stimulation(short shafted)  Comfort (Breast/Nipple): Soft / non-tender  Hold (Positioning): Assistance needed to correctly position infant at breast and maintain latch.  LATCH Score: 7  Interventions Interventions: Breast feeding basics reviewed;Assisted with latch;Skin to skin;Breast massage;Hand express;Breast compression;Adjust position;Position options;Support pillows  Lactation Tools Discussed/Used     Consult Status Consult Status: Follow-up Date:  10/19/19 Follow-up type: In-patient    Darleen Moffitt R Khalea Ventura 10/18/2019, 1:25 PM

## 2019-10-18 NOTE — Lactation Note (Signed)
This note was copied from a baby's chart. Lactation Consultation Note  Patient Name: Katie Thomas [id number] Date: 10/18/2019 Reason for consult: Follow-up assessment;Primapara;1st time breastfeeding;Term  72 hours old FT female who is being exclusively BF by her mother, she's a P1. Mom requested assistance with latch, she had a bruised nipple from an earlier feeding on the left breast. Mom had questions about baby's feeding pattern because she has started cueing more often and she was concerned about not having enough colostrum.   Assisted mom with hand expression and colostrum was noted, LC finger fed baby two drops of thick colostrum prior latching to calm her down, she was crying. Noticed that mom had wide spaced breasts but she reported (+) breast changes during the pregnancy.   Mom willing to try STS. LC took baby STS to mother's left breast in football position and she was able to latch right away. A few audible swallows noted during the 6 minutes feeding, baby self released from the breast and fell asleep, LC showed mom how to burp baby while doing STS.  Reviewed prevention and treatment for sore nipples, normal newborn behavior, cluster feeding patters and feeding cues. Parents very appreciative, they know to call for assistance for feedings, and are aware that there's only one LC for the entire hospital but that their RN will get them started with the feedings if LC is not available at the time.  Feeding plan  1. Encouraged mom to keep feeding baby STS 8-12 times/24 hours or sooner if feeding cues are present 2. Hand expression and spoon feeding were also encouraged  Parents reported all questions and concerns were answered, they're both aware of [place] OP services and will call PRN.  Maternal Data    Feeding Feeding Type: Breast Fed  LATCH Score Latch: Repeated attempts needed to sustain latch, nipple held in mouth throughout feeding, stimulation needed to elicit sucking  reflex.  Audible Swallowing: Spontaneous and intermittent  Type of Nipple: Everted at rest and after stimulation  Comfort (Breast/Nipple): Soft / non-tender  Hold (Positioning): No assistance needed to correctly position infant at breast.  LATCH Score: 9  Interventions Interventions: Breast feeding basics reviewed;Assisted with latch;Skin to skin;Breast massage;Hand express;Breast compression;Adjust position;Support pillows;Coconut oil  Lactation Tools Discussed/Used Tools: Coconut oil   Consult Status Consult Status: Follow-up Date: 10/19/19 Follow-up type: In-patient    Katie Thomas 10/18/2019, 8:29 PM

## 2019-10-18 NOTE — Progress Notes (Signed)
[name] was referred for history of depression/anxiety. * Referral screened out by Clinical Social Worker because none of the following criteria appear to apply: ~ History of anxiety/depression during this pregnancy, or of post-partum depression following prior delivery. ~ Diagnosis of anxiety and/or depression within last 3 years. Per further chart review, [name] diagnosed with depression in 2017.  OR * [name]'s symptoms currently being treated with medication and/or therapy.    CSW aware that [name] scored 8 on [place] with no concerns to CSW.       Katie Thomas S. Katie Thomas, MSW, LCSW [facility] [phone]  

## 2019-10-19 ENCOUNTER — Ambulatory Visit: Payer: Self-pay

## 2019-10-19 NOTE — Lactation Note (Signed)
This note was copied from a baby's chart. Lactation Consultation Note:  Infant is 59 hours old. Mother attempting to latch infant sitting up in chair using football hold. Father at the side assisting.   Mother reports that she has a Nipple shield but unsure if infant is getting anything.  Mother taught to check for ebm in the shield.   Mother reports that her nipples are very sore that the latch has been painful .   Assist mother with latching infant. Mother reports that she feels slight pain on the initial latch. Observed infant chomping at the beginning of the feeding.  Assist with flanging infants upper lip and adjusting infants lower jaw for wider gape. Mother reports that latch is not painful.  Observed 20 min feeding with intermittent swallows.  Assist mother with placing infant in cross cradle hold. Infant sustained latch for a period of time. Mother switched infant to the alternate breast because infant was continuing to cue.   Assist mother with hand expression. Only a few drops of colostrum obtained and father allowed infant to lick off his finger. Mother does have a 3-4 finger span  Encouraged mother to post pump and give infant any amt that is obtained.   Parents were given supplemental guidelines and encouraged to offer infant ebm after each feeding.  Mother has a Spectra pump at home. She was advised to pump after feedings to protect her milk supply and to offer supplement.   Discussed treatment and prevention of engorgement .   Mother to continue to cue base feed, allow for cluster feeding. Feed infant 8-12 times or more in 24 hours . Encouraged frequent STS. Informed of the benefits.   Suggested that mother follow up with [facility] services as an outpatient.  Mother receptive to all teaching . She is aware of available services at [facility].   Patient Name: Katie Thomas [id number] Date: 10/19/2019 Reason for consult: Follow-up assessment   Maternal Data     Feeding Feeding Type: Breast Fed  LATCH Score Latch: Grasps breast easily, tongue down, lips flanged, rhythmical sucking.  Audible Swallowing: Spontaneous and intermittent  Type of Nipple: Everted at rest and after stimulation  Comfort (Breast/Nipple): Filling, red/small blisters or bruises, mild/mod discomfort  Hold (Positioning): Assistance needed to correctly position infant at breast and maintain latch.  LATCH Score: 8  Interventions Interventions: Assisted with latch;Skin to skin;Hand express;Pre-pump if needed;Breast compression;Adjust position;Support pillows;Position options;Hand pump;DEBP  Lactation Tools Discussed/Used Tools: Pump;Comfort gels;Coconut oil;Nipple Shields Nipple shield size: 20 Breast pump type: Double-Electric Breast Pump Pump Review: Setup, frequency, and cleaning Initiated by:: karen kane rn Date initiated:: 10/19/19   Consult Status      Jess Barters McCoy 10/19/2019, 11:50 AM

## 2019-10-19 NOTE — Discharge Summary (Signed)
OB Discharge Summary     Patient Name: Katie Thomas DOB: 1986-06-05 MRN: [id number]  Date of admission: 10/17/2019 Delivering MD: Janyth Pupa   Date of discharge: 10/19/2019  Admitting diagnosis: Labor and delivery, indication for care [O75.9] Intrauterine pregnancy: [redacted]w[redacted]d     Secondary diagnosis:  Active Problems:   Labor and delivery, indication for care  Additional problems: obesity     Discharge diagnosis: Term Pregnancy Delivered                                                                                                Post partum procedures:none  Augmentation: Pitocin and Cytotec  Complications: None  Hospital course:  Induction of Labor With Vaginal Delivery   33 y.o. yo G2P1011 at [redacted]w[redacted]d was admitted to the hospital 10/17/2019 for induction of labor.  Indication for induction: Postdates.  Patient had an uncomplicated labor course as follows: Membrane Rupture Time/Date: 5:49 PM ,10/17/2019   Intrapartum Procedures: Episiotomy: None [1]                                         Lacerations:  2nd degree [3]  Patient had delivery of a Viable infant.  Information for the patient's newborn:  Tykeshia, [place] [[id number]]  Delivery Method: Vaginal, Spontaneous(Filed from Delivery Summary)    10/17/2019  Details of delivery can be found in separate delivery note.  Patient had a routine postpartum course. Patient is discharged home 10/19/19.  Physical exam  Vitals:   10/18/19 0917 10/18/19 1617 10/18/19 2337 10/19/19 0514  BP: 120/64 118/72 120/77 119/74  Pulse: 76 76 77 74  Resp: 18 18 18 18   Temp: 98.7 F (37.1 C)  98.2 F (36.8 C) 98.4 F (36.9 C)  TempSrc: Oral  Oral Oral  SpO2:  98% 100% 99%  Weight:      Height:       General: alert, cooperative and no distress Lochia: appropriate Uterine Fundus: firm Incision: N/A DVT Evaluation: No evidence of DVT seen on physical exam. Labs: Lab Results  Component Value Date   WBC 13.4 (H) 10/18/2019   HGB 10.0 (L) 10/18/2019   HCT 29.6 (L) 10/18/2019   MCV 96.4 10/18/2019   PLT 168 10/18/2019   CMP Latest Ref Rng & Units 12/01/2011  Total Protein 6.4 - 8.2 g/dL 7.2  Total Bilirubin 0.2 - 1.0 mg/dL 0.3  Alkaline Phos 50 - 136 Unit/L 40(L)  AST 15 - 37 Unit/L 27  ALT U/L 34    Discharge instruction: per After Visit Summary and "Baby and Me Booklet".  After visit meds:  Allergies as of 10/19/2019   No Known Allergies     Medication List    TAKE these medications   calcium carbonate 500 MG chewable tablet Commonly known as: TUMS - dosed in mg elemental calcium Chew 2-4 tablets by mouth as needed for indigestion or heartburn.   doxylamine (Sleep) 25 MG tablet Commonly known as: UNISOM Take 25 mg by mouth at bedtime as needed  for sleep.   ibuprofen 600 MG tablet Commonly known as: ADVIL Take 1 tablet (600 mg total) by mouth every 6 (six) hours.   prenatal multivitamin Tabs tablet Take 1 tablet by mouth daily at 12 noon.       Diet: routine diet  Activity: Advance as tolerated. Pelvic rest for 6 weeks.   Outpatient follow up:3 weeks Follow up Appt:No future appointments. Follow up Visit:No follow-ups on file.  Postpartum contraception: Not Discussed  Newborn Data: Live born female  Birth Weight: 6 lb 13 oz (3090 g) APGAR: 9, 9  Newborn Delivery   Birth date/time: 10/17/2019 18:07:00 Delivery type: Vaginal, Spontaneous      Baby Feeding: Breast Disposition:home with mother   10/19/2019 Sharon Seller, DO

## 2019-10-19 NOTE — Lactation Note (Signed)
This note was copied from a baby's chart. Lactation Consultation Note;  Staff nurse ask LC to check latch. Observed infant in football hold. Mother reports no pain with latch . Observed that infant was suckling and swallowing with steady pattern.  Mother to follow up with [facility] services.   Patient Name: Katie Thomas INOMV'E Date: 10/19/2019 Reason for consult: Follow-up assessment   Maternal Data    Feeding Feeding Type: Breast Fed  LATCH Score Latch: Grasps breast easily, tongue down, lips flanged, rhythmical sucking.  Audible Swallowing: Spontaneous and intermittent  Type of Nipple: Everted at rest and after stimulation  Comfort (Breast/Nipple): Soft / non-tender  Hold (Positioning): No assistance needed to correctly position infant at breast.  LATCH Score: 10  Interventions    Lactation Tools Discussed/Used     Consult Status      Darla Lesches 10/19/2019, 4:05 PM

## 2019-10-21 LAB — SURGICAL PATHOLOGY

## 2020-06-15 ENCOUNTER — Other Ambulatory Visit: Payer: Self-pay | Admitting: Internal Medicine

## 2020-06-15 ENCOUNTER — Ambulatory Visit
Admission: RE | Admit: 2020-06-15 | Discharge: 2020-06-15 | Disposition: A | Discharge status: Home / Self Care | Payer: 59 | Source: Ambulatory Visit | Attending: Internal Medicine | Admitting: Internal Medicine

## 2020-06-15 DIAGNOSIS — M25561 Pain in right knee: Secondary | ICD-10-CM

## 2020-06-15 DIAGNOSIS — M25559 Pain in unspecified hip: Secondary | ICD-10-CM

## 2020-06-15 DIAGNOSIS — M25562 Pain in left knee: Secondary | ICD-10-CM

## 2020-09-20 IMAGING — CR DG KNEE COMPLETE 4+V*L*
4 series · 4 of 4 positions shown · non-contrast
Comparison: None.

CLINICAL DATA: Left knee pain

EXAM:
LEFT KNEE - COMPLETE 4+ VIEW

[t knee ap left]
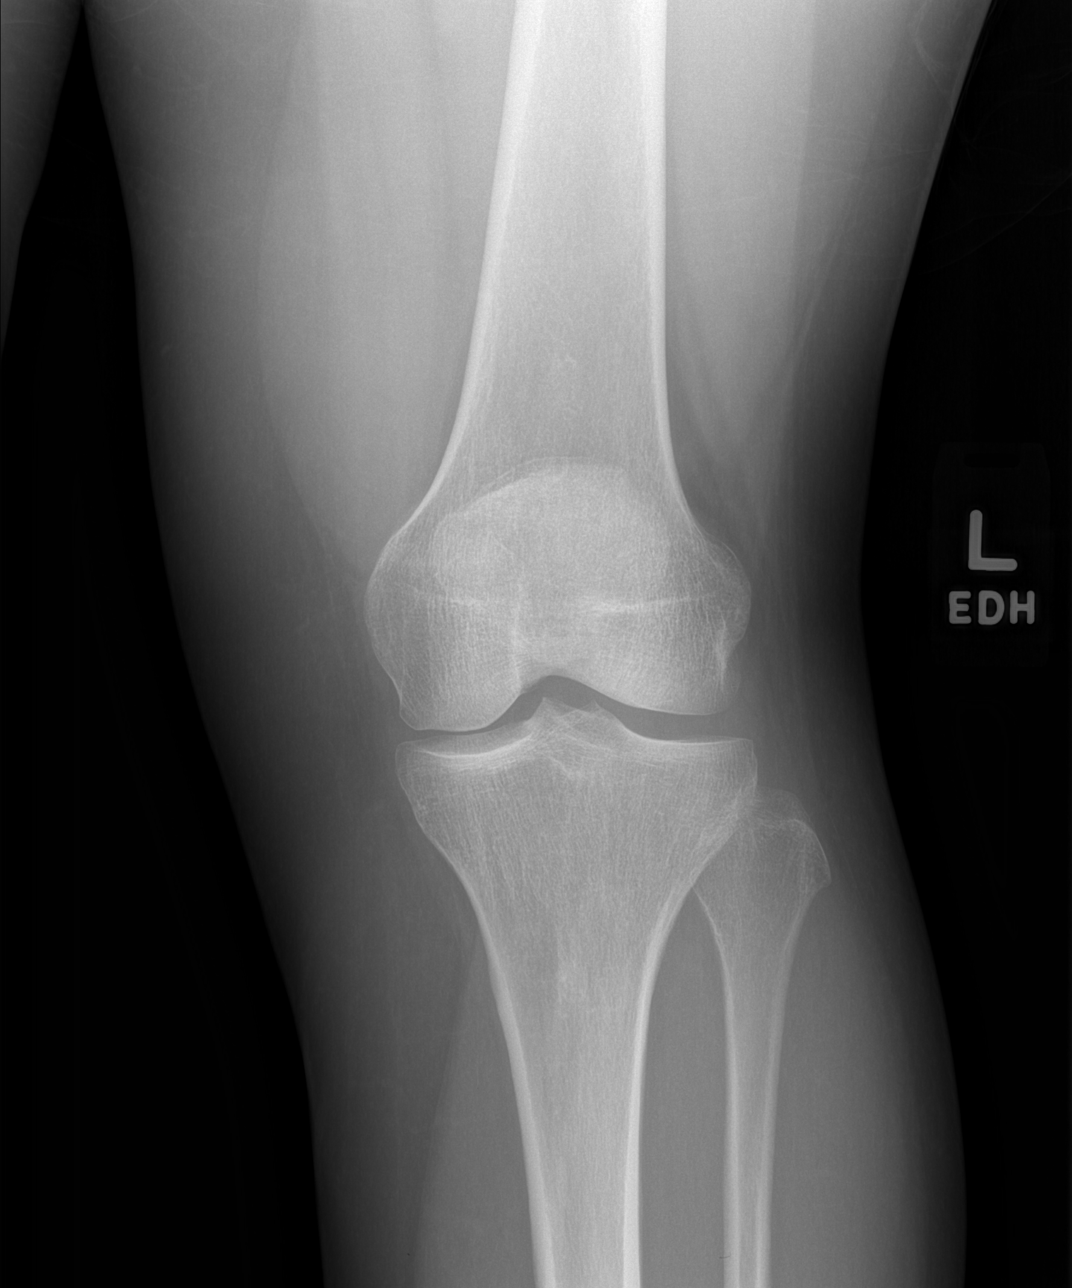

[t knee oblique left (1 of 2)]
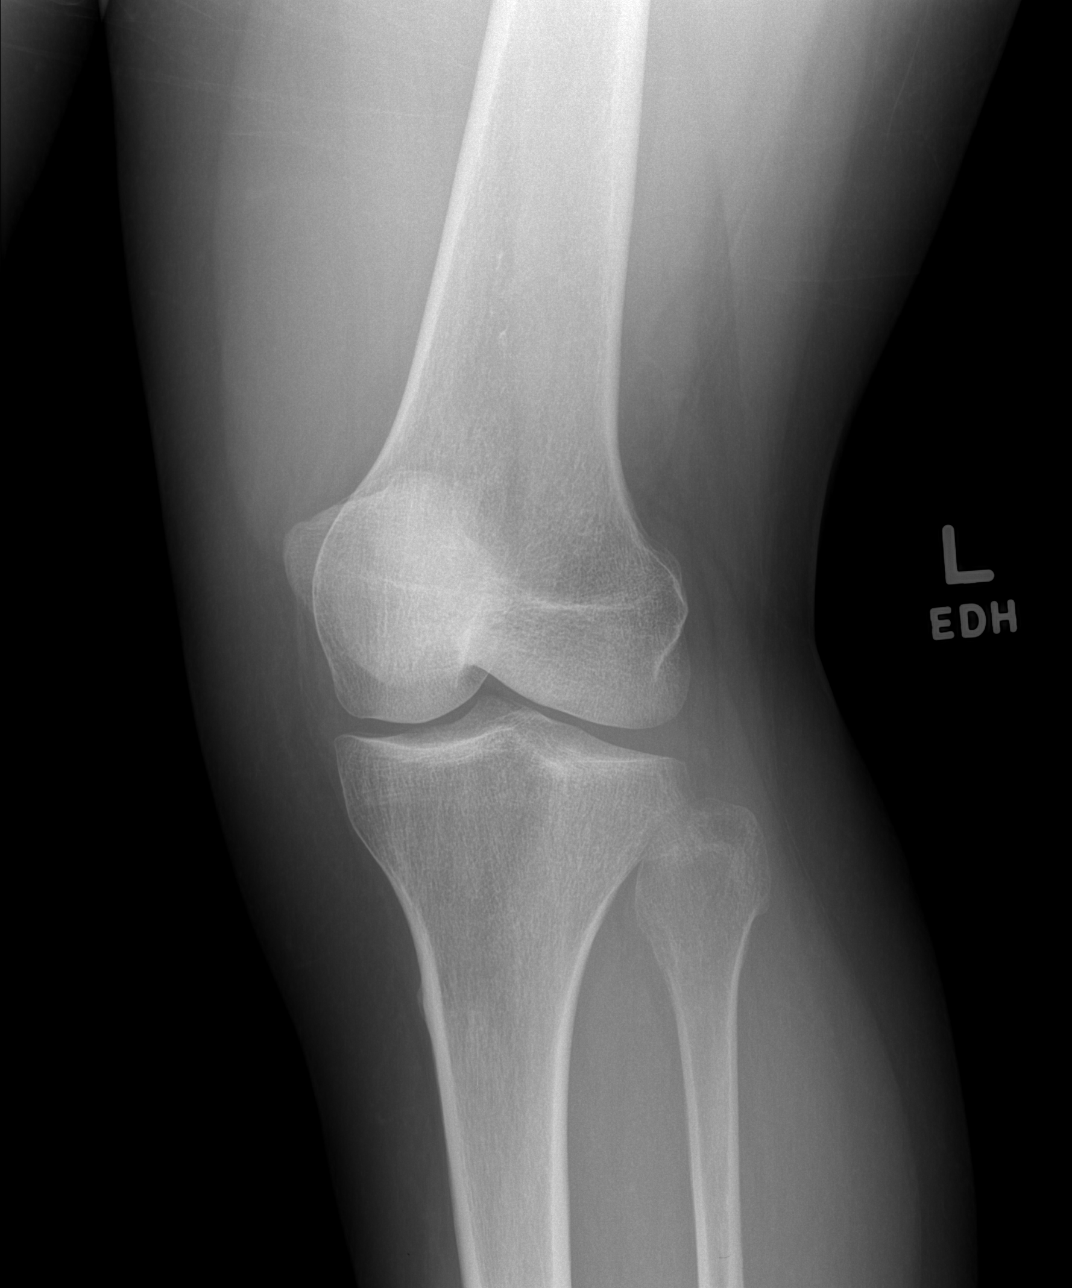

[t knee oblique left (2 of 2)]
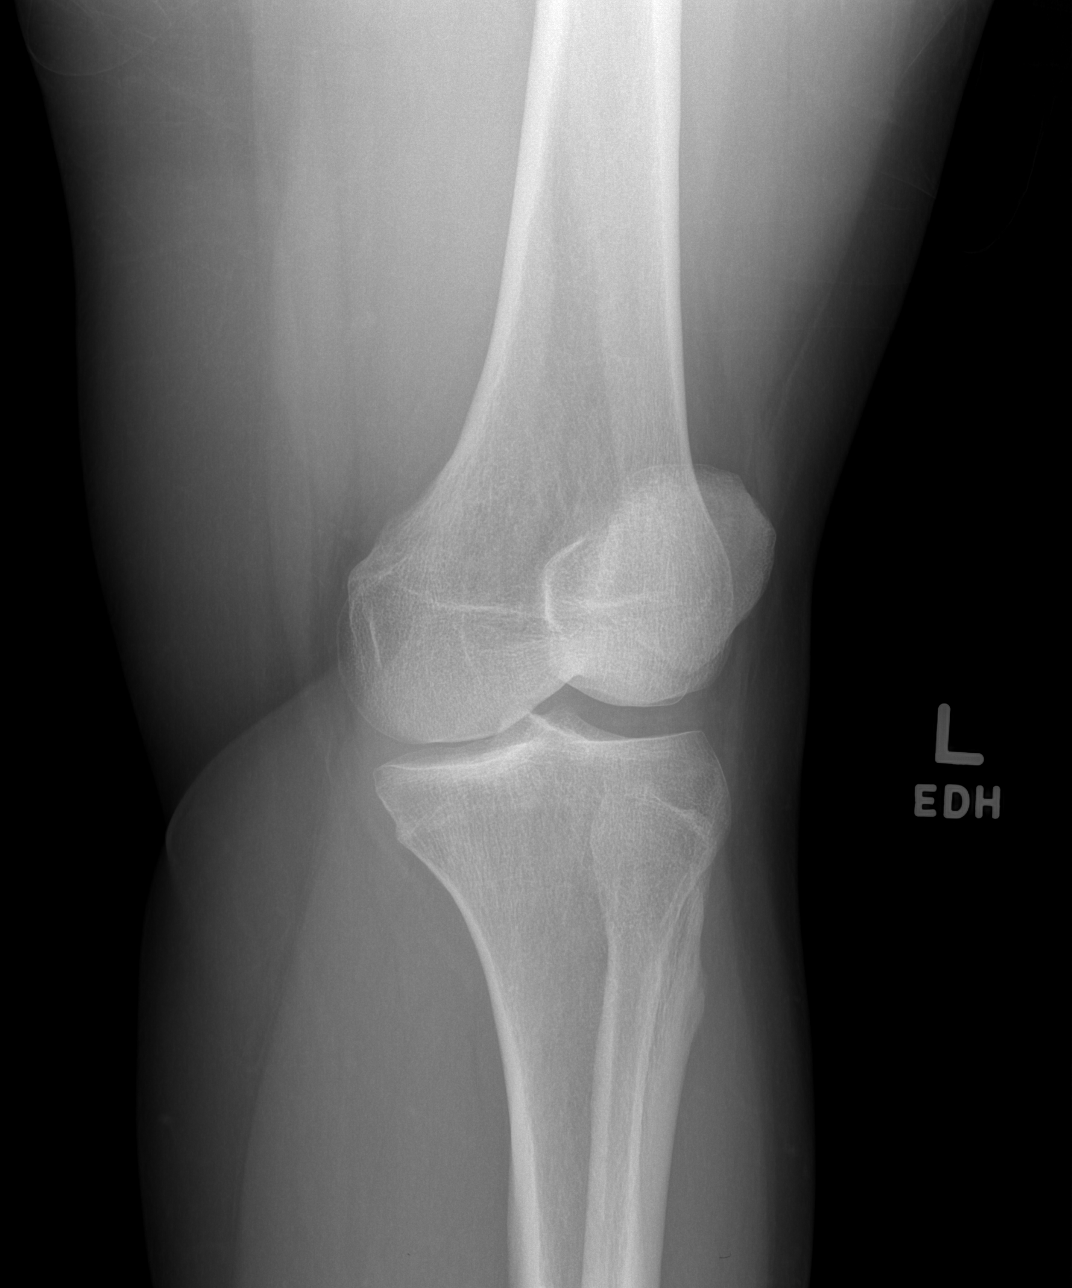

[t knee lat left]
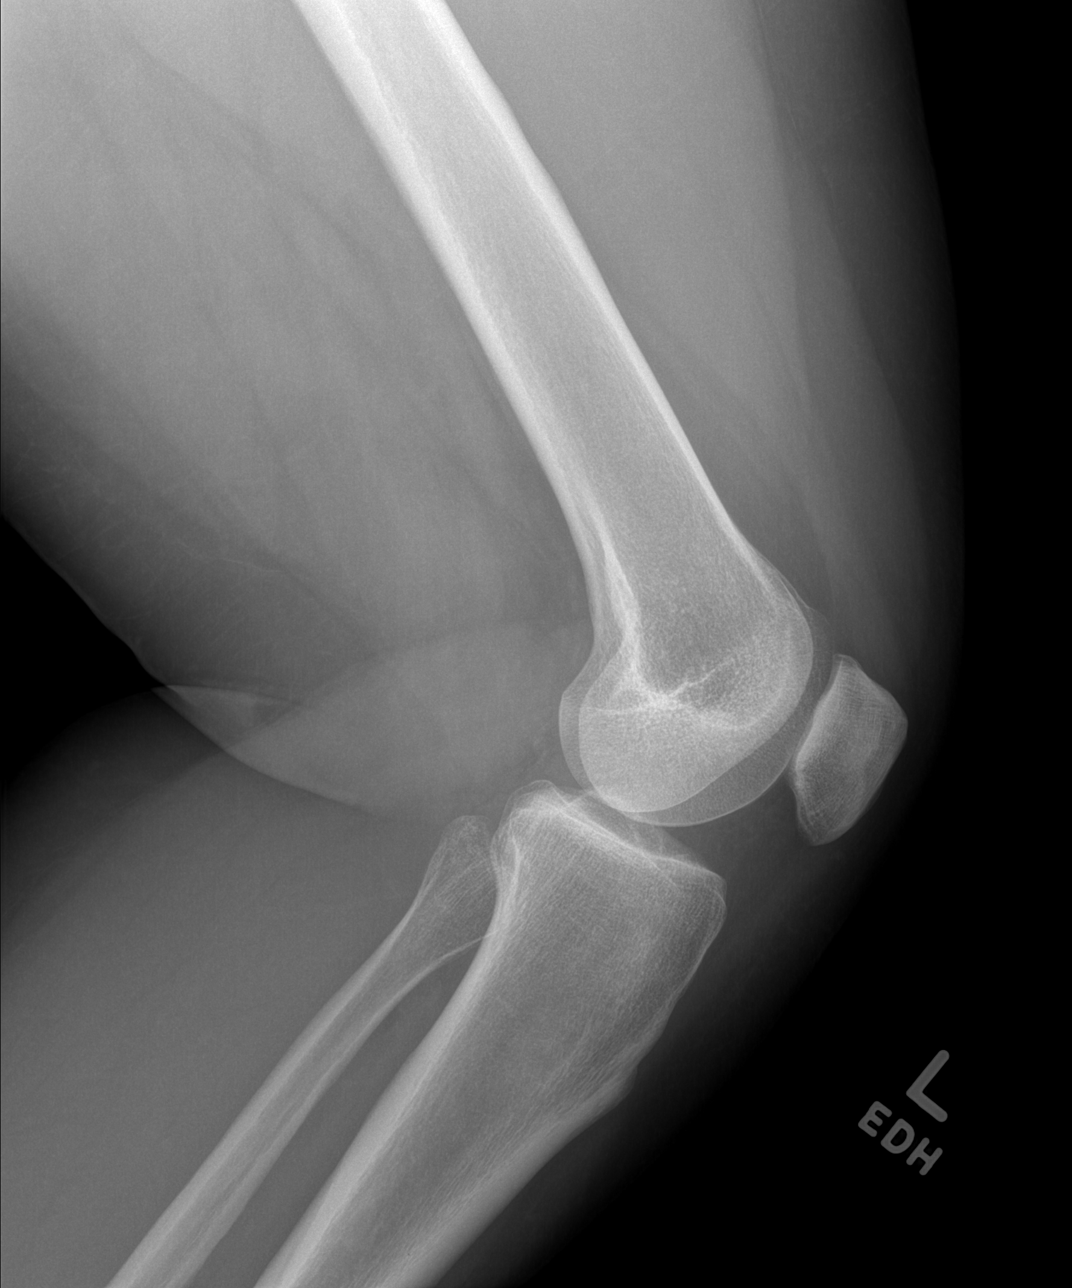

[4 of 4 positions shown; findings below may reference images not displayed]

FINDINGS: No evidence of fracture, dislocation, or joint effusion. No evidence
of arthropathy or other focal bone abnormality. Soft tissues are
unremarkable.
IMPRESSION: Negative.

## 2020-09-20 IMAGING — CR DG HIP (WITH OR WITHOUT PELVIS) 2-3V*L*
3 series · 3 of 3 positions shown · non-contrast
Comparison: None.

CLINICAL DATA: Bilateral hip pain

EXAM:
DG HIP (WITH OR WITHOUT PELVIS) 2-3V LEFT

[t pelvis a.p. (1 of 2)]
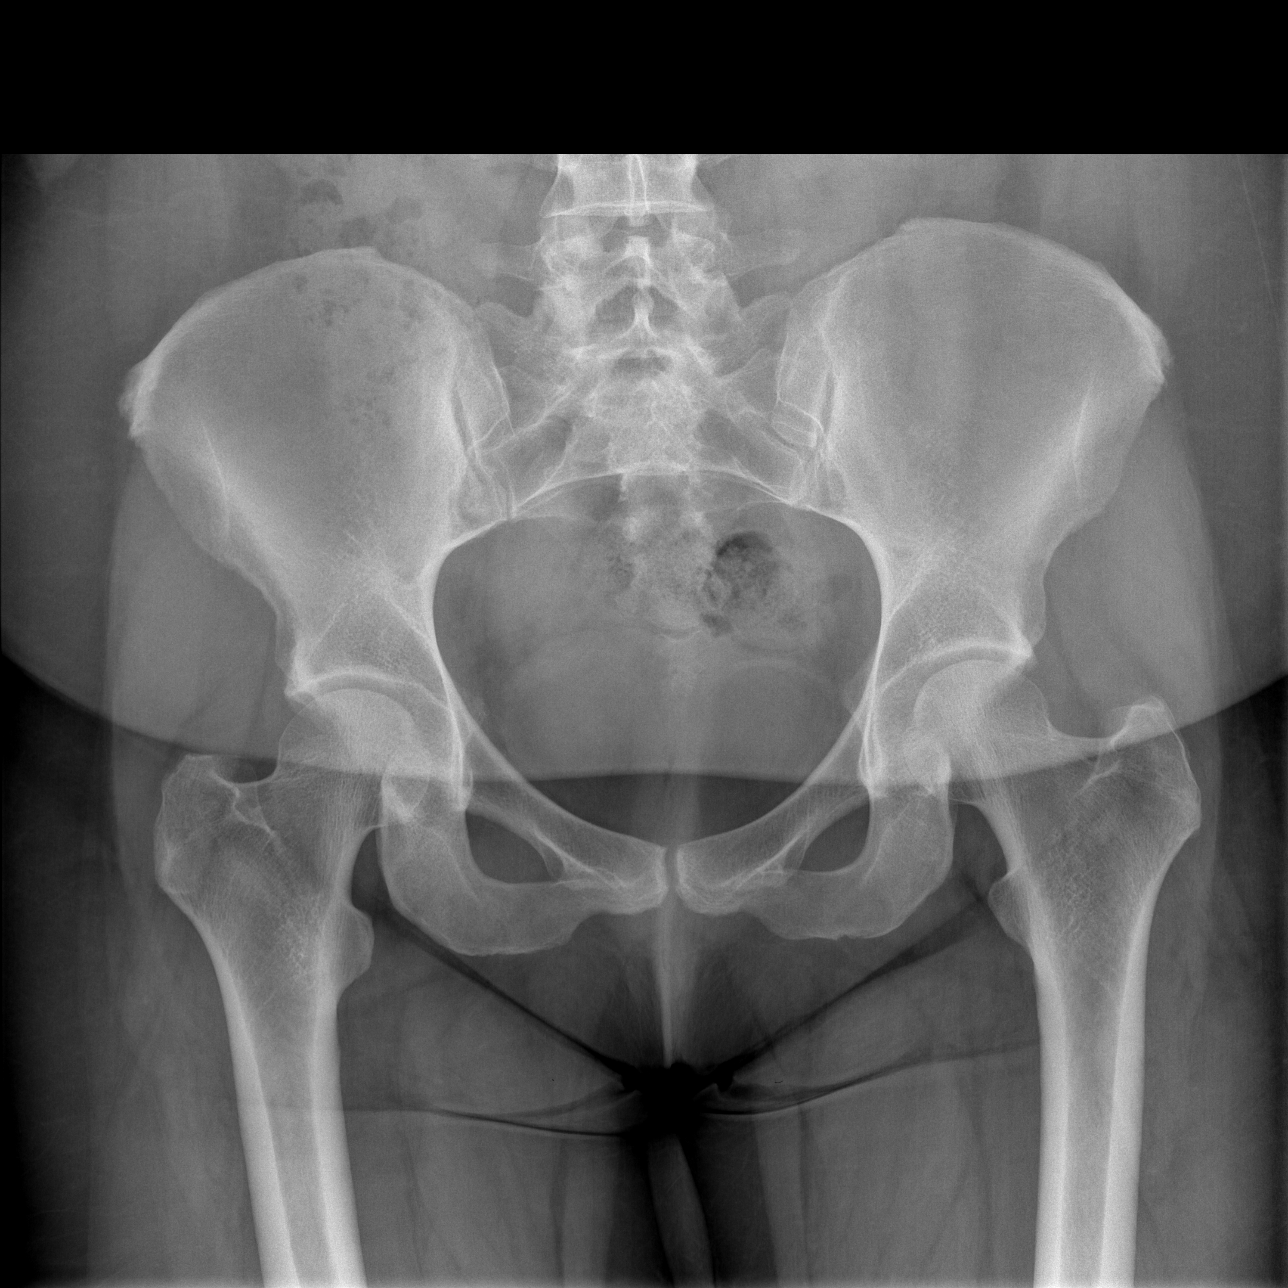

[t pelvis a.p. (2 of 2)]
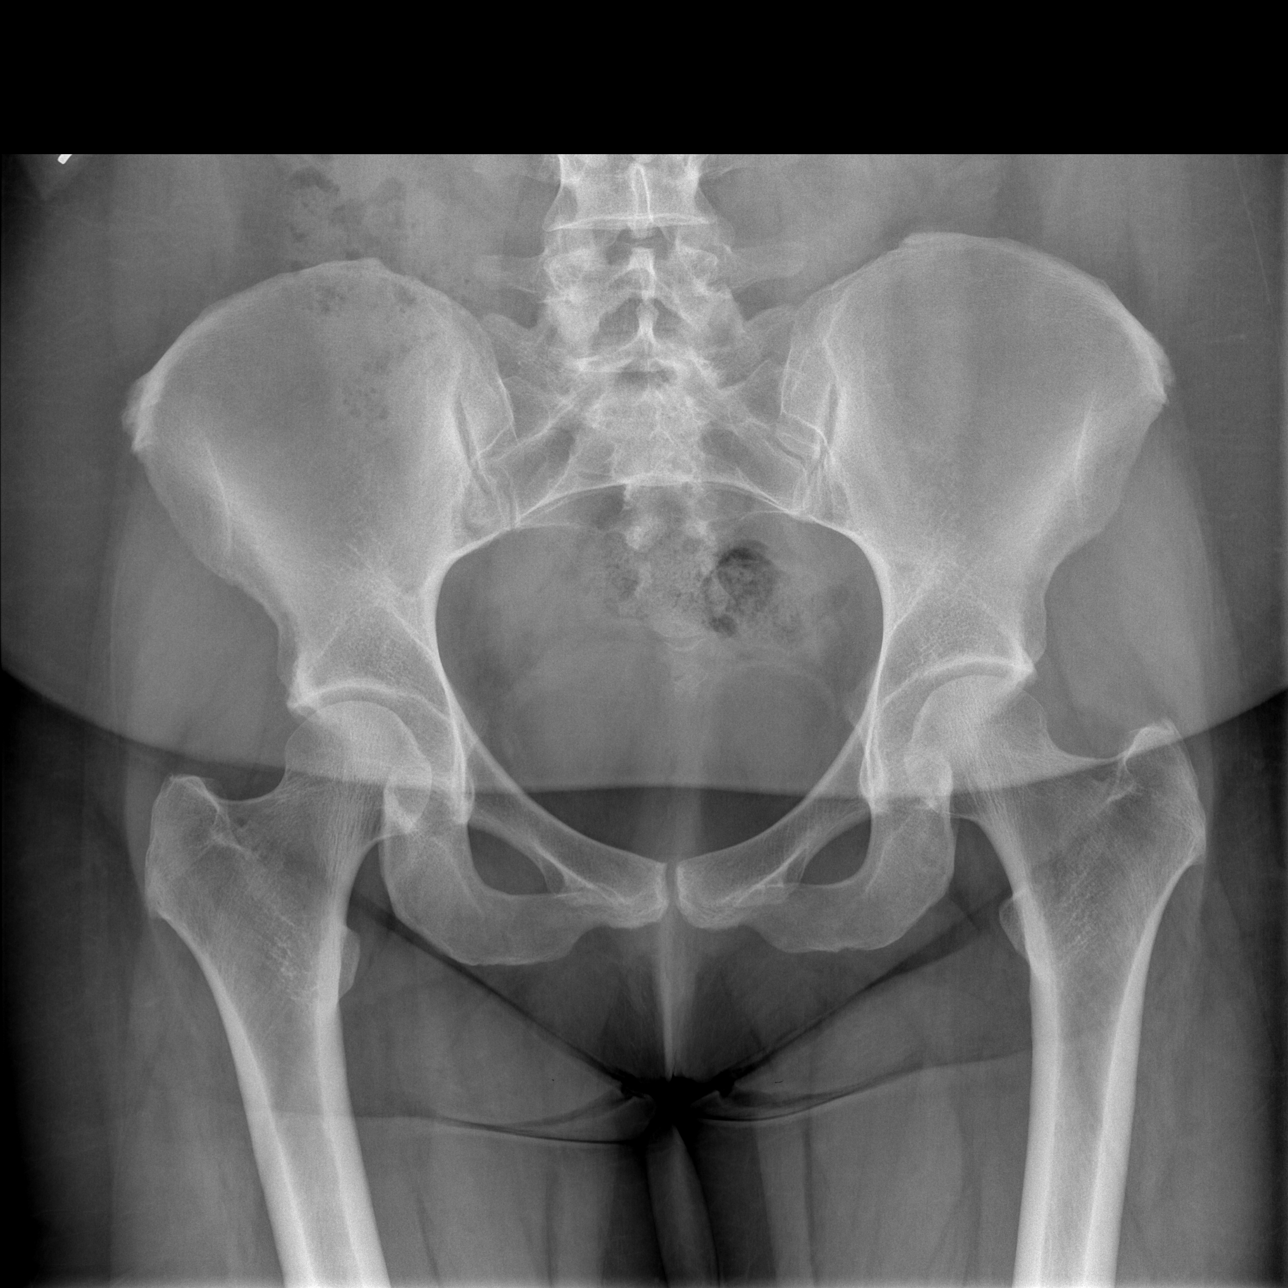

[t hip frog leg left]
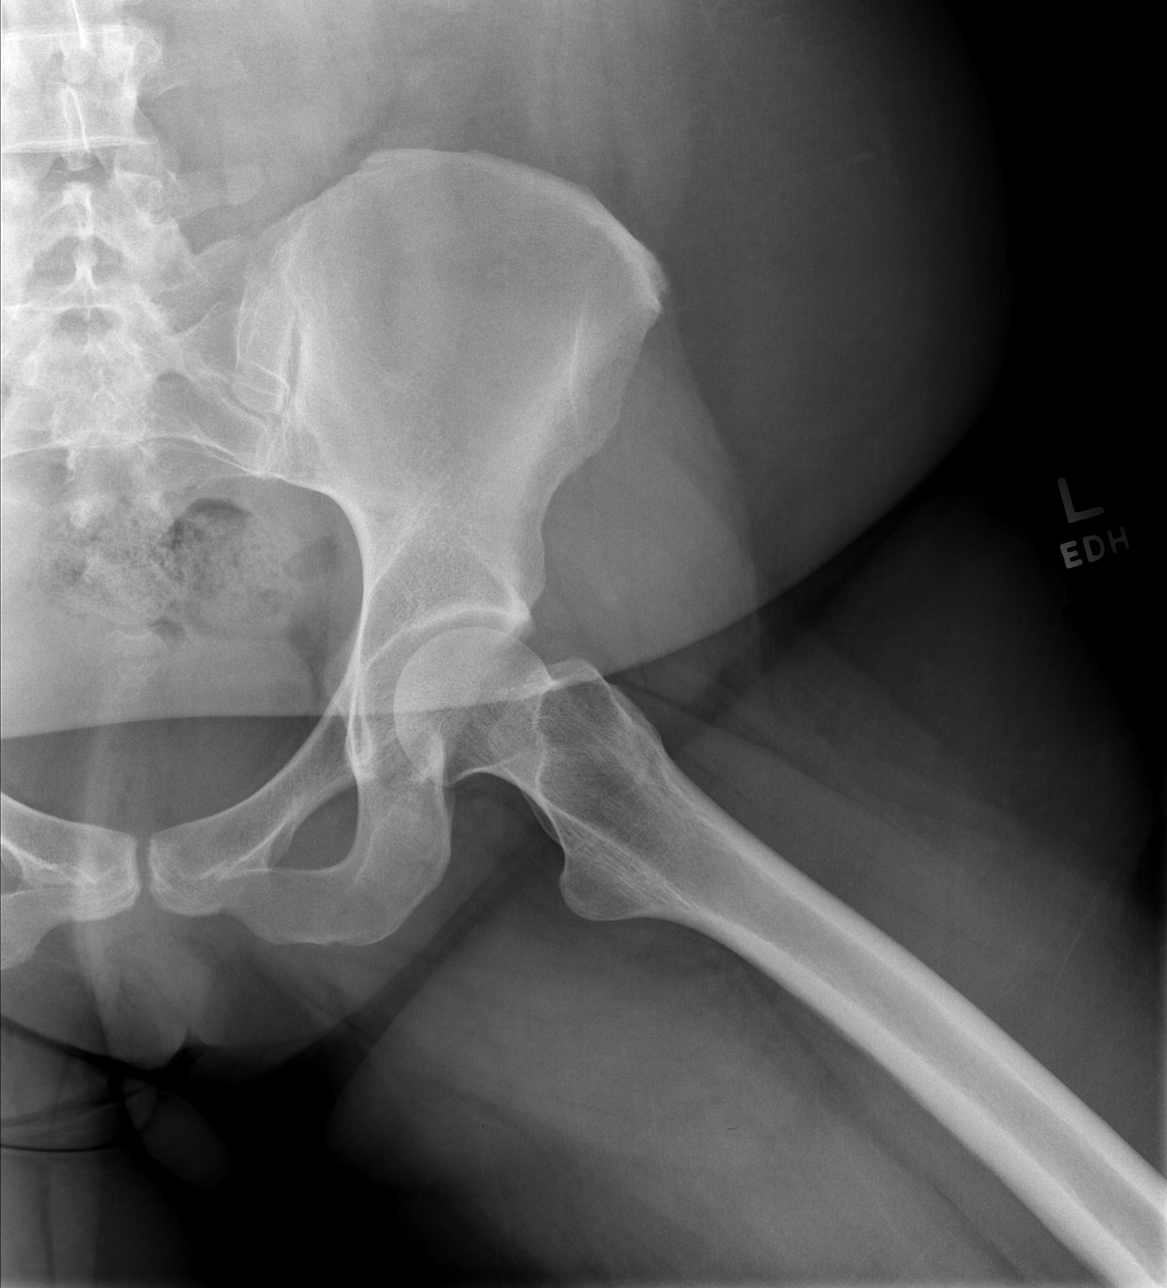

[3 of 3 positions shown; findings below may reference images not displayed]

FINDINGS: There is no evidence of hip fracture or dislocation. There is no
evidence of arthropathy or other focal bone abnormality.
IMPRESSION: Negative.

## 2021-08-01 ENCOUNTER — Emergency Department (HOSPITAL_COMMUNITY): Admission: EM | Admit: 2021-08-01 | Discharge: 2021-08-01 | Discharge status: Against Medical Advice | Payer: 59

## 2021-08-01 NOTE — ED Notes (Signed)
X2 no response °

## 2021-08-01 NOTE — ED Notes (Signed)
X1 for triage vitals with no response 

## 2021-08-01 NOTE — ED Notes (Signed)
Called for triage x1; called several times for vitals

## 2022-05-04 ENCOUNTER — Encounter: Payer: Self-pay | Admitting: Orthopaedic Surgery

## 2022-05-04 ENCOUNTER — Ambulatory Visit (INDEPENDENT_AMBULATORY_CARE_PROVIDER_SITE_OTHER): Payer: 59 | Admitting: Orthopaedic Surgery

## 2022-05-04 VITALS — Ht 64.0 in | Wt 217.0 lb

## 2022-05-04 DIAGNOSIS — M25561 Pain in right knee: Secondary | ICD-10-CM

## 2022-05-04 DIAGNOSIS — M25562 Pain in left knee: Secondary | ICD-10-CM

## 2022-05-04 DIAGNOSIS — M25571 Pain in right ankle and joints of right foot: Secondary | ICD-10-CM

## 2022-05-04 DIAGNOSIS — G8929 Other chronic pain: Secondary | ICD-10-CM

## 2022-05-04 DIAGNOSIS — M25531 Pain in right wrist: Secondary | ICD-10-CM | POA: Diagnosis not present

## 2022-05-04 DIAGNOSIS — M2550 Pain in unspecified joint: Secondary | ICD-10-CM

## 2022-05-04 DIAGNOSIS — M25572 Pain in left ankle and joints of left foot: Secondary | ICD-10-CM

## 2022-05-04 DIAGNOSIS — M25532 Pain in left wrist: Secondary | ICD-10-CM

## 2022-05-04 NOTE — Progress Notes (Signed)
Office Visit Note   Patient: Katie Thomas           Date of Birth: [birth year]           MRN: [id number] Visit Date: 05/04/2022              Requested by: Glori Luis, MD [address] [place],  [place] [address] PCP: Glori Luis, MD   Assessment & Plan: Visit Diagnoses:  1. Chronic pain of both knees   2. Bilateral wrist pain   3. Chronic pain of both ankles   4. Chronic joint pain     Plan: Ms. Gruber relates a chronic history of multiple joint arthralgias.  She first noted onset several months after giving birth to her daughter in December 2020.  She did not have any injury or trauma.  She notes that her pain comes and go and involves multiple joints including her feet ankles hands and knees.  She has been seen by her primary care physician x-rays were obtained of her hips and knees.  I reviewed these on the PACS system and agree that there was no specific abnormality.  SHe has also had several lab studies including rheumatoid factor ANA and several others that were negative for autoimmune disease.  Problem has been frustrating that extremely interferes with a lot of her activities.  Her exam today was benign.  She might very early butterfly rash on her face but I really think the best approach would be to have her evaluated by rheumatology.  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   Orders:  Orders Placed This Encounter  Procedures   Ambulatory referral to Rheumatology   Ambulatory referral to Physical Therapy   No orders of the defined types were placed in this encounter.     Procedures: No procedures performed   Clinical Data: No additional findings.   Subjective: Chief Complaint  Patient presents with   Right Knee - Pain   Left Knee - Pain  Patient presents today with multiple joint  pain. She said that it started three months after giving birth in December 2020. It initially was just both knees. Since it has progressed to both wrist, hands, ankles, and feet. She said that the pain comes and goes. She takes tylenol as needed.   HPI  Review of Systems   Objective: Vital Signs: Ht 5\' 4"  (1.626 m)   Wt 217 lb (98.4 kg)   BMI 37.25 kg/m   Physical Exam Constitutional:      Appearance: She is well-developed.  Eyes:     Pupils: Pupils are equal, round, and reactive to light.  Pulmonary:     Effort: Pulmonary effort is normal.  Skin:    General: Skin is warm and dry.  Neurological:     Mental Status: She is alert and oriented to person, place, and time.  Psychiatric:        Behavior: Behavior normal.     Ortho Exam awake alert and oriented x3.  Comfortable sitting.  No acute distress.  Painless range of motion of both shoulders hips and knees although she did have considerable crepitation with patella flexion extension.  No knee effusion.  Neurologically intact.  No obvious deformity.  Reflexes were symmetrical to both upper and lower extremities.  No percussible tenderness cervical or lumbar spine.  I thought she might have very early butterfly rash on her face.  No nail pitting.  No swelling of her digits or deformities.  Able to make a fist and release with good grip.  No joint swelling in either hand or wrist.  Negative impingement testing both shoulders with full quick overhead motion.  Specialty Comments:  No specialty comments available.  Imaging: No results found.   PMFS History: Patient Active Problem List   Diagnosis Date Noted   Chronic joint pain 05/04/2022   Labor and delivery, indication for care 10/17/2019   Thoracic back pain 02/19/2016   Anxiety and depression 02/19/2016   Abdominal pain 02/19/2016   Past Medical History:  Diagnosis Date   Depression    Eating disorder    Gallstones     Family History  Problem Relation Age of Onset    Breast cancer Other    Heart disease Other    Mental illness Other    Diabetes Other    Mental illness Mother    Diabetes Maternal Grandmother    COPD Maternal Grandfather    Heart disease Maternal Grandfather    Endometrial cancer Paternal Grandmother     Past Surgical History:  Procedure Laterality Date   CHOLECYSTECTOMY  2012   Social History   Occupational History   Not on file  Tobacco Use   Smoking status: Never   Smokeless tobacco: Never  Vaping Use   Vaping Use: Never used  Substance and Sexual Activity   Alcohol use: Not Currently    Alcohol/week: 0.0 standard drinks of alcohol   Drug use: No   Sexual activity: Not on file

## 2022-09-05 ENCOUNTER — Other Ambulatory Visit (HOSPITAL_COMMUNITY): Payer: Self-pay | Admitting: Internal Medicine

## 2022-09-05 DIAGNOSIS — R002 Palpitations: Secondary | ICD-10-CM

## 2022-09-05 DIAGNOSIS — R0609 Other forms of dyspnea: Secondary | ICD-10-CM

## 2022-09-11 ENCOUNTER — Encounter (HOSPITAL_COMMUNITY): Payer: Self-pay | Admitting: Internal Medicine

## 2022-09-18 ENCOUNTER — Telehealth (HOSPITAL_COMMUNITY): Payer: Self-pay | Admitting: Internal Medicine

## 2022-09-18 NOTE — Telephone Encounter (Signed)
Just an FYI. We have made several attempts to contact this patient including sending a letter to schedule or reschedule their GXT . We will be removing the patient from the echo WQ.   MAILED LETTER 09/11/22 [facility] to schedule x 3 @ 2:54/LBW 09/08/22 [facility] to schedule @ 8:27/LBW 09/05/22 [facility] to schedule @ 3:12/LBW        Thank you

## 2022-11-07 NOTE — Progress Notes (Signed)
Office Visit Note  Patient: Katie Thomas             Date of Birth: [birth year]           MRN: [id number]             PCP: Leone Haven, MD Referring: Garald Balding, MD Visit Date: 11/21/2022 Occupation: @GUAROCC @  Subjective:  Pain in multiple joints  History of Present Illness: Katie Thomas is a 37 y.o. female in consultation per request of Dr. Durward Fortes.  According to the patient her symptoms started 5 months postpartum in May 2021 with increased pain in her knee joints.  She states her knee joint progressed over the next few months to the point she was having difficulty climbing stairs.  She states her knees were very stiff and also felt crepitus in her knees.  She states the symptoms gradually improved over the last few years.  In November 2022 she started experiencing pain in her wrist joints, her hands and her both ankles.  She states the pain increased for several months she was having difficulty gripping objects and holding objects.  She felt weakness in her hands.  She also noticed achiness in her ankle joints after prolonged sitting.  In April 2023 her symptoms improved to some extent but she still had some dull achiness.  She states her ankles are still stiff.  She still continues to have pain and discomfort in her feet especially her MTP joints.  She feels intermittent tingling in her hands and her feet.  Symptoms fluctuate.  She has occasional discomfort in her elbows and her forearms.  She had similar symptoms in 2015 while she living in [place] and she has shoulder pain which resolved eventually.  She denies any history of Planter fasciitis, Achilles tendinitis.  History of fatigue, oral ulcers.  She states oral ulcers are mostly on the palate and the cheeks and also her tongue.  She states oral ulcers happens about twice a month.  History of Raynaud's phenomenon, lymphadenopathy or inflammatory arthritis.  There is no history of psoriasis.  There is no family history of  psoriasis.  Her mother has positive ANA and fibromyalgia.  She is gravida 1, para 1, miscarriages 0 there is no history of DVTs.  She has chronic insomnia for which she takes supplements.  She also gives history of fatigue.    Activities of Daily Living:  Patient reports morning stiffness for 20 minutes.   Patient Reports nocturnal pain.  Difficulty dressing/grooming: Denies Difficulty climbing stairs: Denies Difficulty getting out of chair: Reports Difficulty using hands for taps, buttons, cutlery, and/or writing: Denies  Review of Systems  Constitutional:  Positive for fatigue.  HENT:  Positive for mouth sores and mouth dryness.   Eyes:  Negative for dryness.  Respiratory:  Negative for difficulty breathing.   Cardiovascular:  Positive for palpitations. Negative for chest pain.       EKG was normal by PCP.  Gastrointestinal:  Negative for blood in stool, constipation and diarrhea.  Endocrine: Negative for increased urination.  Genitourinary:  Negative for involuntary urination.  Musculoskeletal:  Positive for joint pain, joint pain, myalgias, morning stiffness, muscle tenderness and myalgias. Negative for gait problem, joint swelling and muscle weakness.  Skin:  Positive for redness and sensitivity to sunlight. Negative for color change, rash and hair loss.  Allergic/Immunologic: Negative for susceptible to infections.  Neurological:  Positive for light-headedness, headaches and parasthesias. Negative for dizziness and numbness.  Hematological:  Negative for swollen glands.  Psychiatric/Behavioral:  Positive for depressed mood. Negative for sleep disturbance. The patient is nervous/anxious.     PMFS History:  Patient Active Problem List   Diagnosis Date Noted   Chronic joint pain 05/04/2022   Labor and delivery, indication for care 10/17/2019   Thoracic back pain 02/19/2016   Anxiety and depression 02/19/2016   Abdominal pain 02/19/2016    Past Medical History:  Diagnosis  Date   Depression    Eating disorder    Gallstones    Insulin resistance     Family History  Problem Relation Age of Onset   Mental illness Mother    Lupus Mother    Fibromyalgia Mother    Hypothyroidism Sister    Arthritis Sister    Diabetes Maternal Grandmother    COPD Maternal Grandfather    Heart disease Maternal Grandfather    Endometrial cancer Paternal Grandmother    Breast cancer Other    Heart disease Other    Mental illness Other    Diabetes Other    Healthy Daughter    Past Surgical History:  Procedure Laterality Date   CHOLECYSTECTOMY  2012   Social History   Social History Narrative   Not on file    There is no immunization history on file for this patient.   Objective: Vital Signs: BP 131/78 (BP Location: Right Arm, Patient Position: Sitting, Cuff Size: Large)   Pulse 84   Resp 16   Ht 5\' 5"  (1.651 m)   Wt 215 lb 6.4 oz (97.7 kg)   BMI 35.84 kg/m    Physical Exam Vitals and nursing note reviewed.  Constitutional:      Appearance: She is well-developed.  HENT:     Head: Normocephalic and atraumatic.  Eyes:     Conjunctiva/sclera: Conjunctivae normal.  Cardiovascular:     Rate and Rhythm: Normal rate and regular rhythm.     Heart sounds: Normal heart sounds.  Pulmonary:     Effort: Pulmonary effort is normal.     Breath sounds: Normal breath sounds.  Abdominal:     General: Bowel sounds are normal.     Palpations: Abdomen is soft.  Musculoskeletal:     Cervical back: Normal range of motion.  Lymphadenopathy:     Cervical: No cervical adenopathy.  Skin:    General: Skin is warm and dry.     Capillary Refill: Capillary refill takes less than 2 seconds.     Comments: Mild facial erythema was noted.  No nailbed capillary changes were noted.  No sclerodactyly was noted.  Neurological:     Mental Status: She is alert and oriented to person, place, and time.  Psychiatric:        Behavior: Behavior normal.      Musculoskeletal Exam:  Cervical, thoracic and lumbar spine were in good range of motion.  She had no SI joint tenderness.  Shoulder joints, elbow joints, wrist joints, MCPs PIPs and DIPs been good range of motion.  She had bilateral DIP thickening.  Hip joints and knee joints were in good range of motion without any warmth swelling or effusion.  She had crepitus in her bilateral knee joints.  She had bilateral pes cavus and callus formation under her metatarsals.  No synovitis was noted.  There was no planter fasciitis or Achilles tendinitis.  CDAI Exam: CDAI Score: -- Patient Global: --; Provider Global: -- Swollen: --; Tender: -- Joint Exam 11/21/2022   No joint exam has been  documented for this visit   There is currently no information documented on the homunculus. Go to the Rheumatology activity and complete the homunculus joint exam.  Investigation: No additional findings.  Imaging: No results found.  Recent Labs: Lab Results  Component Value Date   WBC 13.4 (H) 10/18/2019   HGB 10.0 (L) 10/18/2019   PLT 168 10/18/2019   BILITOT 0.3 12/01/2011   ALKPHOS 40 (L) 12/01/2011   AST 27 12/01/2011   ALT 34 12/01/2011   PROT 7.2 12/01/2011   ALBUMIN 3.9 12/01/2011    Speciality Comments: No specialty comments available.  Procedures:  No procedures performed Allergies: Patient has no known allergies.   Assessment / Plan:     Visit Diagnoses: Pain in both hands -she complains of pain and discomfort in her bilateral hands since November 2022.  She states of pain and discomfort is in her wrist joints and in her hands.  She has not noticed visible swelling.  No synovitis was noted on the examination.  She notices decreased grip strength and difficulty holding objects intermittently.  She also complains of intermittent paresthesias.  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  Bilateral DIP prominence was noted.  I will obtain following labs and x-rays today.  Plan: Sedimentation rate, ANA, Rheumatoid factor,  Cyclic citrul peptide antibody, IgG, XR Hand 2 View Right, XR Hand 2 View Left.  X-rays of bilateral hands were unremarkable.  Chronic pain of both knees -she complains of pain and discomfort in her bilateral knee joints since May 2021.  She notices crepitus in her bilateral knee joints and also has difficulty climbing up and down the stairs off-and-on.  She states her symptoms were worse in 2021 during the postpartum time.  The symptoms have improved but she still have knee joint discomfort.  No warmth swelling or effusion was noted.  Plan: XR KNEE 3 VIEW RIGHT, XR KNEE 3 VIEW LEFT.  X-rays showed bilateral mild chondromalacia patella.  Chronic pain of both ankles-she has intermittent pain and puffiness in her ankles.  Pain in both feet -she complains of discomfort in her bilateral feet especially under her metatarsals.  She had bilateral pes cavus and had callus formation under her metatarsals.  No synovitis was noted.  Plan: XR Foot 2 Views Right, XR Foot 2 Views Left.  X-rays of bilateral feet were unremarkable.  Rash-she gives history of redness on her face.  She had redness on her face and her nose.  Recurrent oral ulcers -she gives history of frequent oral ulcers about twice a month on the palate of her mouth, cheeks and her tongue.  I will obtain following labs today.  Plan: Anti-DNA antibody, double-stranded, Anti-Smith antibody, RNP Antibody, Anti-scleroderma antibody, C3 and C4  Dry mouth -she gives a history of had dry mouth.  She denies any history of dry eyes.  Plan: Sjogrens syndrome-B extractable nuclear antibody, Sjogrens syndrome-A extractable nuclear antibody  Other fatigue -she has history of fatigue for several years.  Will obtain following labs today.  Plan: CBC with Differential/Platelet, COMPLETE METABOLIC PANEL WITH GFR, TSH, CK, Serum protein electrophoresis with reflex  Primary insomnia-she has history of primary insomnia.  She used to take Unisom.  She is taking several  supplements now.  Anxiety and depression-she gives history of anxiety and depression.  She is currently not taking any medications.  Vitamin D deficiency -she has history of vitamin D deficiency.  She has been experiencing increased fatigue.  Will check vitamin D level  today.  Plan: VITAMIN D 25 Hydroxy (Vit-D Deficiency, Fractures)  Family history of fibromyalgia-mother - Her mother has positive ANA, fibromyalgia and personality disorder.  Orders: Orders Placed This Encounter  Procedures   XR Hand 2 View Right   XR Hand 2 View Left   XR KNEE 3 VIEW RIGHT   XR KNEE 3 VIEW LEFT   XR Foot 2 Views Right   XR Foot 2 Views Left   CBC with Differential/Platelet   COMPLETE METABOLIC PANEL WITH GFR   Sedimentation rate   ANA   Rheumatoid factor   Cyclic citrul peptide antibody, IgG   Anti-DNA antibody, double-stranded   Sjogrens syndrome-B extractable nuclear antibody   Sjogrens syndrome-A extractable nuclear antibody   Anti-Smith antibody   RNP Antibody   Anti-scleroderma antibody   C3 and C4   TSH   CK   Serum protein electrophoresis with reflex   VITAMIN D 25 Hydroxy (Vit-D Deficiency, Fractures)   No orders of the defined types were placed in this encounter.   Follow-Up Instructions: Return for Fatigue, joint pain.   Pollyann Savoy, MD  Note - This record has been created using Animal nutritionist.  Chart creation errors have been sought, but may not always  have been located. Such creation errors do not reflect on  the standard of medical care.

## 2022-11-21 ENCOUNTER — Ambulatory Visit: Payer: 59 | Attending: Rheumatology | Admitting: Rheumatology

## 2022-11-21 ENCOUNTER — Ambulatory Visit: Payer: 59

## 2022-11-21 ENCOUNTER — Ambulatory Visit (INDEPENDENT_AMBULATORY_CARE_PROVIDER_SITE_OTHER): Payer: 59

## 2022-11-21 ENCOUNTER — Encounter: Payer: Self-pay | Admitting: Rheumatology

## 2022-11-21 VITALS — BP 131/78 | HR 84 | Resp 16 | Ht 65.0 in | Wt 215.4 lb

## 2022-11-21 DIAGNOSIS — M25562 Pain in left knee: Secondary | ICD-10-CM

## 2022-11-21 DIAGNOSIS — M79671 Pain in right foot: Secondary | ICD-10-CM

## 2022-11-21 DIAGNOSIS — G8929 Other chronic pain: Secondary | ICD-10-CM

## 2022-11-21 DIAGNOSIS — M25571 Pain in right ankle and joints of right foot: Secondary | ICD-10-CM | POA: Diagnosis not present

## 2022-11-21 DIAGNOSIS — F5101 Primary insomnia: Secondary | ICD-10-CM

## 2022-11-21 DIAGNOSIS — M25531 Pain in right wrist: Secondary | ICD-10-CM

## 2022-11-21 DIAGNOSIS — F32A Depression, unspecified: Secondary | ICD-10-CM

## 2022-11-21 DIAGNOSIS — R21 Rash and other nonspecific skin eruption: Secondary | ICD-10-CM

## 2022-11-21 DIAGNOSIS — M79641 Pain in right hand: Secondary | ICD-10-CM

## 2022-11-21 DIAGNOSIS — M79672 Pain in left foot: Secondary | ICD-10-CM

## 2022-11-21 DIAGNOSIS — M25561 Pain in right knee: Secondary | ICD-10-CM | POA: Diagnosis not present

## 2022-11-21 DIAGNOSIS — M79642 Pain in left hand: Secondary | ICD-10-CM | POA: Diagnosis not present

## 2022-11-21 DIAGNOSIS — R5383 Other fatigue: Secondary | ICD-10-CM

## 2022-11-21 DIAGNOSIS — E559 Vitamin D deficiency, unspecified: Secondary | ICD-10-CM

## 2022-11-21 DIAGNOSIS — K1379 Other lesions of oral mucosa: Secondary | ICD-10-CM

## 2022-11-21 DIAGNOSIS — Z8269 Family history of other diseases of the musculoskeletal system and connective tissue: Secondary | ICD-10-CM

## 2022-11-21 DIAGNOSIS — F419 Anxiety disorder, unspecified: Secondary | ICD-10-CM

## 2022-11-21 DIAGNOSIS — M25572 Pain in left ankle and joints of left foot: Secondary | ICD-10-CM

## 2022-11-21 DIAGNOSIS — R682 Dry mouth, unspecified: Secondary | ICD-10-CM

## 2022-11-22 ENCOUNTER — Other Ambulatory Visit: Payer: Self-pay | Admitting: *Deleted

## 2022-11-22 DIAGNOSIS — E559 Vitamin D deficiency, unspecified: Secondary | ICD-10-CM

## 2022-11-22 MED ORDER — VITAMIN D (ERGOCALCIFEROL) 1.25 MG (50000 UNIT) PO CAPS: 50000.0000 [IU] | ORAL_CAPSULE | ORAL | 0 refills | Status: DC

## 2022-11-22 NOTE — Telephone Encounter (Signed)
-----  Message from Bo Merino, MD sent at 11/22/2022  8:05 AM EST ----- Vitamin D is low.  Please call in vitamin D 50,000 units twice a week for 3 months.  Recheck vitamin D level in 3 months.  After finishing the course of vitamin D patient should take vitamin D 2000 units daily.

## 2022-11-22 NOTE — Progress Notes (Signed)
Vitamin D is low.  Please call in vitamin D 50,000 units twice a week for 3 months.  Recheck vitamin D level in 3 months.  After finishing the course of vitamin D patient should take vitamin D 2000 units daily.

## 2022-11-24 LAB — COMPLETE METABOLIC PANEL WITH GFR
AG Ratio: 1.8 (calc) (ref 1.0–2.5)
ALT: 14 U/L (ref 6–29)
AST: 21 U/L (ref 10–30)
Albumin: 4.6 g/dL (ref 3.6–5.1)
Alkaline phosphatase (APISO): 53 U/L (ref 31–125)
BUN: 10 mg/dL (ref 7–25)
CO2: 29 mmol/L (ref 20–32)
Calcium: 9.6 mg/dL (ref 8.6–10.2)
Chloride: 103 mmol/L (ref 98–110)
Creat: 0.77 mg/dL (ref 0.50–0.97)
Globulin: 2.5 g/dL (calc) (ref 1.9–3.7)
Glucose, Bld: 95 mg/dL (ref 65–99)
Potassium: 4.3 mmol/L (ref 3.5–5.3)
Sodium: 141 mmol/L (ref 135–146)
Total Bilirubin: 0.2 mg/dL (ref 0.2–1.2)
Total Protein: 7.1 g/dL (ref 6.1–8.1)
eGFR: 103 mL/min/{1.73_m2} (ref >=60)

## 2022-11-24 LAB — PROTEIN ELECTROPHORESIS, SERUM, WITH REFLEX
Albumin ELP: 4.1 g/dL (ref 3.8–4.8)
Alpha 1: 0.3 g/dL (ref 0.2–0.3)
Alpha 2: 0.7 g/dL (ref 0.5–0.9)
Beta 2: 0.4 g/dL (ref 0.2–0.5)
Beta Globulin: 0.4 g/dL (ref 0.4–0.6)
Gamma Globulin: 1 g/dL (ref 0.8–1.7)
Total Protein: 6.9 g/dL (ref 6.1–8.1)

## 2022-11-24 LAB — CBC WITH DIFFERENTIAL/PLATELET
Absolute Monocytes: 623 cells/uL (ref 200–950)
Basophils Absolute: 47 cells/uL (ref 0–200)
Basophils Relative: 0.7 %
Eosinophils Absolute: 80 cells/uL (ref 15–500)
Eosinophils Relative: 1.2 %
HCT: 40.6 % (ref 35.0–45.0)
Hemoglobin: 13.7 g/dL (ref 11.7–15.5)
Lymphs Abs: 1668 cells/uL (ref 850–3900)
MCH: 30.1 pg (ref 27.0–33.0)
MCHC: 33.7 g/dL (ref 32.0–36.0)
MCV: 89.2 fL (ref 80.0–100.0)
MPV: 11.8 fL (ref 7.5–12.5)
Monocytes Relative: 9.3 %
Neutro Abs: 4281 cells/uL (ref 1500–7800)
Neutrophils Relative %: 63.9 %
Platelets: 209 10*3/uL (ref 140–400)
RBC: 4.55 10*6/uL (ref 3.80–5.10)
RDW: 12.2 % (ref 11.0–15.0)
Total Lymphocyte: 24.9 %
WBC: 6.7 10*3/uL (ref 3.8–10.8)

## 2022-11-24 LAB — CYCLIC CITRUL PEPTIDE ANTIBODY, IGG: Cyclic Citrullin Peptide Ab: <16 UNITS

## 2022-11-24 LAB — SEDIMENTATION RATE: Sed Rate: 22 mm/h — ABNORMAL HIGH (ref 0–20)

## 2022-11-24 LAB — SJOGRENS SYNDROME-A EXTRACTABLE NUCLEAR ANTIBODY: SSA (Ro) (ENA) Antibody, IgG: <1 AI

## 2022-11-24 LAB — ANTI-SCLERODERMA ANTIBODY: Scleroderma (Scl-70) (ENA) Antibody, IgG: <1 AI

## 2022-11-24 LAB — RHEUMATOID FACTOR: Rheumatoid fact SerPl-aCnc: <14 IU/mL (ref <14)

## 2022-11-24 LAB — SJOGRENS SYNDROME-B EXTRACTABLE NUCLEAR ANTIBODY: SSB (La) (ENA) Antibody, IgG: <1 AI

## 2022-11-24 LAB — CK: Total CK: 106 U/L (ref 29–143)

## 2022-11-24 LAB — ANA: Anti Nuclear Antibody (ANA): NEGATIVE

## 2022-11-24 LAB — RNP ANTIBODY: Ribonucleic Protein(ENA) Antibody, IgG: <1 AI

## 2022-11-24 LAB — ANTI-DNA ANTIBODY, DOUBLE-STRANDED: ds DNA Ab: 1 IU/mL

## 2022-11-24 LAB — C3 AND C4
C3 Complement: 115 mg/dL (ref 83–193)
C4 Complement: 26 mg/dL (ref 15–57)

## 2022-11-24 LAB — ANTI-SMITH ANTIBODY: ENA SM Ab Ser-aCnc: <1 AI

## 2022-11-24 LAB — VITAMIN D 25 HYDROXY (VIT D DEFICIENCY, FRACTURES): Vit D, 25-Hydroxy: 18 ng/mL — ABNORMAL LOW (ref 30–100)

## 2022-11-24 LAB — TSH: TSH: 3.09 mIU/L

## 2022-11-24 NOTE — Progress Notes (Signed)
I will discuss results at the fu visit.

## 2022-11-29 NOTE — Progress Notes (Signed)
Office Visit Note  Patient: Katie Thomas             Date of Birth: October 26, 1986           MRN: [id number]             PCP: Leone Haven, MD Referring: Garald Balding, MD Visit Date: 12/12/2022 Occupation: @GUAROCC$ @  Subjective:  Pain in joints and fatigue  History of Present Illness: Simeon Cuttino is a 37 y.o. female with history of pain in multiple joints and muscles.  She states that she continues to have pain and discomfort in all of her joints and muscles.  She has been taking vitamin D 50,000 units twice a week now.  She has noticed improvement in her fatigue and achiness to some extent.  She states she still has discomfort in her hands, ankles and her feet.  She continues to have dry mouth and dry eyes.  She states she has sores in her mouth almost every day.  She gives history of intermittent rash on her face.  She declines having rash today.  She states her insomnia has improved since she has been using cherry juice at night.  She is currently not taking any medications for anxiety and depression.  She believes that her depression has been better since she has been taking vitamin D.    Activities of Daily Living:  Patient reports morning stiffness for 15 minutes.   Patient Reports nocturnal pain.  Difficulty dressing/grooming: Denies Difficulty climbing stairs: Reports Difficulty getting out of chair: Reports Difficulty using hands for taps, buttons, cutlery, and/or writing: Reports  Review of Systems  Constitutional:  Positive for fatigue.  HENT:  Positive for mouth sores and mouth dryness.   Eyes: Negative.  Negative for dryness.  Respiratory: Negative.  Negative for shortness of breath.   Cardiovascular: Negative.  Negative for chest pain and palpitations.  Gastrointestinal: Negative.  Negative for blood in stool, constipation and diarrhea.  Endocrine: Negative.  Negative for increased urination.  Genitourinary: Negative.  Negative for involuntary urination.   Musculoskeletal:  Positive for joint pain, joint pain, joint swelling, myalgias, muscle weakness, morning stiffness, muscle tenderness and myalgias. Negative for gait problem.  Skin: Negative.  Negative for color change, rash, hair loss and sensitivity to sunlight.  Allergic/Immunologic: Negative.  Negative for susceptible to infections.  Neurological: Negative.  Negative for dizziness and headaches.  Hematological: Negative.  Negative for swollen glands.  Psychiatric/Behavioral:  Positive for depressed mood. The patient is not nervous/anxious.     PMFS History:  Patient Active Problem List   Diagnosis Date Noted   Chronic joint pain 05/04/2022   Labor and delivery, indication for care 10/17/2019   Thoracic back pain 02/19/2016   Anxiety and depression 02/19/2016   Abdominal pain 02/19/2016    Past Medical History:  Diagnosis Date   Depression    Eating disorder    Gallstones    Insulin resistance     Family History  Problem Relation Age of Onset   Mental illness Mother    Lupus Mother    Fibromyalgia Mother    Hypothyroidism Sister    Arthritis Sister    Diabetes Maternal Grandmother    COPD Maternal Grandfather    Heart disease Maternal Grandfather    Endometrial cancer Paternal Grandmother    Breast cancer Other    Heart disease Other    Mental illness Other    Diabetes Other    Healthy Daughter    Past  Surgical History:  Procedure Laterality Date   CHOLECYSTECTOMY  2012   Social History   Social History Narrative   Not on file    There is no immunization history on file for this patient.   Objective: Vital Signs: BP 121/82 (BP Location: Left Arm, Patient Position: Sitting, Cuff Size: Normal)   Pulse 86   Resp 16   Ht 5' 5"$  (1.651 m)   BMI 35.84 kg/m    Physical Exam Vitals and nursing note reviewed.  Constitutional:      Appearance: She is well-developed.  HENT:     Head: Normocephalic and atraumatic.  Eyes:     Conjunctiva/sclera:  Conjunctivae normal.  Cardiovascular:     Rate and Rhythm: Normal rate and regular rhythm.     Heart sounds: Normal heart sounds.  Pulmonary:     Effort: Pulmonary effort is normal.     Breath sounds: Normal breath sounds.  Abdominal:     General: Bowel sounds are normal.     Palpations: Abdomen is soft.  Musculoskeletal:     Cervical back: Normal range of motion.  Lymphadenopathy:     Cervical: No cervical adenopathy.  Skin:    General: Skin is warm and dry.     Capillary Refill: Capillary refill takes less than 2 seconds.  Neurological:     Mental Status: She is alert and oriented to person, place, and time.  Psychiatric:        Behavior: Behavior normal.      Musculoskeletal Exam: Cervical, thoracic and lumbar spine were in good range of motion.  Shoulders and elbows were in good range of motion.  She had bilateral PIP and DIP thickening with no synovitis.  Hip joints and knee joints were in good range of motion without any warmth swelling or effusion.  There was no swelling or ankles.  She had bilateral pes cavus with callus formation under her metatarsals and lateral aspect of forefoot.  CDAI Exam: CDAI Score: -- Patient Global: --; Provider Global: -- Swollen: --; Tender: -- Joint Exam 12/12/2022   No joint exam has been documented for this visit   There is currently no information documented on the homunculus. Go to the Rheumatology activity and complete the homunculus joint exam.  Investigation: No additional findings.  Imaging: XR Foot 2 Views Left  Result Date: 11/21/2022 No MTP PIP, DIP, intertarsal, tibiotalar, subtalar joint space narrowing was noted.  No erosive changes were noted. Impression: Unremarkable x-rays of the foot.  XR Foot 2 Views Right  Result Date: 11/21/2022 No MTP PIP or DIP narrowing was noticed.  No intertarsal, tibiotalar or subtalar joint space narrowing was noted.  Small inferior calcaneal spur was noted. Impression: Unremarkable x-rays  of the foot except for a small inferior calcaneal spur.  XR KNEE 3 VIEW LEFT  Result Date: 11/21/2022 No medial or lateral compartment narrowing was noted.  Mild patellofemoral narrowing was noted. 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  XR KNEE 3 VIEW RIGHT  Result Date: 11/21/2022 No medial or lateral compartment narrowing was noted.  Mild patellofemoral narrowing was noted. 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  XR Hand 2 View Left  Result Date: 11/21/2022 No CMC, MCP, PIP, DIP, intercarpal or radiocarpal joint space narrowing was noted.  No erosive changes were noted. Impression: Unremarkable x-rays of the hand.  XR Hand 2 View Right  Result Date: 11/21/2022 No CMC, MCP, PIP or DIP narrowing was noted.  No intercarpal or radiocarpal  joint space narrowing was noted.  No erosive changes were noted. Impression: Unremarkable x-rays of the hand.   Recent Labs: Lab Results  Component Value Date   WBC 6.7 11/21/2022   HGB 13.7 11/21/2022   PLT 209 11/21/2022   NA 141 11/21/2022   K 4.3 11/21/2022   CL 103 11/21/2022   CO2 29 11/21/2022   GLUCOSE 95 11/21/2022   BUN 10 11/21/2022   CREATININE 0.77 11/21/2022   BILITOT 0.2 11/21/2022   ALKPHOS 40 (L) 12/01/2011   AST 21 11/21/2022   ALT 14 11/21/2022   PROT 7.1 11/21/2022   PROT 6.9 11/21/2022   ALBUMIN 3.9 12/01/2011   CALCIUM 9.6 11/21/2022   November 21, 2022 ANA negative, ENA (double-stranded DNA, SSA, SSB, Smith, RNP, SCL 70) negative, C3-C4 normal, beta-2 GP 1 negative, RF negative, anti-CCP negative, ESR 22, SPEP normal, CK106, TSH normal, vitamin D 18   Speciality Comments: No specialty comments available.  Procedures:  No procedures performed Allergies: Patient has no known allergies.   Assessment / Plan:     Visit Diagnoses: Primary osteoarthritis of both hands - History of pain in bilateral hands since November 2022.  No synovitis was noted.   X-rays were unremarkable.  She had bilateral PIP and DIP thickening consistent with early osteoarthritis.  All autoimmune labs negative.  Lab results were discussed with the patient.  Detailed counsel regarding osteoarthritis was provided.  A handout on hand exercises was given.  Chronic pain of both knees - Pain in the joints for the last 2-1/2 years.  No warmth swelling or effusion was noted.  X-rays showed bilateral mild chondromalacia patella.  Counseled guarding chondromalacia patella was provided.  A handout on lower extremity muscle strengthening exercises was given.  Chronic pain of both ankles - History of intermittent pain and puffiness in the ankles.  No synovitis was noted.  All autoimmune Negative.  Lab results were discussed with the patient.  Pain in both feet - No synovitis was noted.  X-rays were unremarkable.  Bilateral pes cavus and callus formation under the metatarsals was noted.  Use of orthotics was discussed.  Myofascial pain-she continues to have generalized pain and discomfort.  She also experiences fatigue.  Her mother has fibromyalgia.  Possibility of fibromyalgia was discussed.  She may benefit from the use of Cymbalta in the future.  Need for regular exercise, water aerobics and swimming was discussed.  Benefits from stretching and yoga were also discussed.  I will refer her to integrative therapies.  Rash - History of redness on face and nose.  No rash was noted at the last visit and today.  Recurrent oral ulcers - History of frequent oral ulcers.  All autoimmune workup negative.  I will refer her to dermatology for evaluation.  I advised to avoid NSAID use as it may contribute to oral ulcers.  She may use Tylenol instead.  Dry mouth - SSA negative, SSB negative, ANA negative, RF negative.  All autoimmune workup was negative.  Over-the-counter products were discussed at length.  I also discussed possible use of pilocarpine in the future if her symptoms persist.  Primary  insomnia - better with cheery juice.  Anxiety and depression-she is currently not taking any medications.  Patient states her symptoms have improved on vitamin D.  Vitamin D deficiency -she was prescribed Vit D 50,000 U twice a week.  She will take it for 3 months and get labs after 3 months.  She was advised to take vitamin  D 2000 units daily after finishing the course.  Family history of fibromyalgia-mother  Orders: Orders Placed This Encounter  Procedures   Ambulatory referral to Dermatology   Ambulatory referral to Physical Therapy   No orders of the defined types were placed in this encounter.   Face-to-face time spent with patient was over 30 minutes. Greater than 50% of time was spent in counseling and coordination of care.  Follow-Up Instructions: Return in about 3 months (around 03/12/2023) for sicca, myalgia.   Bo Merino, MD  Note - This record has been created using Editor, commissioning.  Chart creation errors have been sought, but may not always  have been located. Such creation errors do not reflect on  the standard of medical care.

## 2022-12-12 ENCOUNTER — Ambulatory Visit: Payer: 59 | Attending: Rheumatology | Admitting: Rheumatology

## 2022-12-12 ENCOUNTER — Encounter: Payer: Self-pay | Admitting: Rheumatology

## 2022-12-12 VITALS — BP 121/82 | HR 86 | Resp 16 | Ht 65.0 in

## 2022-12-12 DIAGNOSIS — M79642 Pain in left hand: Secondary | ICD-10-CM

## 2022-12-12 DIAGNOSIS — M19042 Primary osteoarthritis, left hand: Secondary | ICD-10-CM

## 2022-12-12 DIAGNOSIS — G8929 Other chronic pain: Secondary | ICD-10-CM

## 2022-12-12 DIAGNOSIS — M79671 Pain in right foot: Secondary | ICD-10-CM

## 2022-12-12 DIAGNOSIS — R21 Rash and other nonspecific skin eruption: Secondary | ICD-10-CM

## 2022-12-12 DIAGNOSIS — F5101 Primary insomnia: Secondary | ICD-10-CM

## 2022-12-12 DIAGNOSIS — M79672 Pain in left foot: Secondary | ICD-10-CM

## 2022-12-12 DIAGNOSIS — F32A Depression, unspecified: Secondary | ICD-10-CM

## 2022-12-12 DIAGNOSIS — R682 Dry mouth, unspecified: Secondary | ICD-10-CM

## 2022-12-12 DIAGNOSIS — E559 Vitamin D deficiency, unspecified: Secondary | ICD-10-CM

## 2022-12-12 DIAGNOSIS — M25561 Pain in right knee: Secondary | ICD-10-CM | POA: Diagnosis not present

## 2022-12-12 DIAGNOSIS — F419 Anxiety disorder, unspecified: Secondary | ICD-10-CM

## 2022-12-12 DIAGNOSIS — M7918 Myalgia, other site: Secondary | ICD-10-CM

## 2022-12-12 DIAGNOSIS — M25571 Pain in right ankle and joints of right foot: Secondary | ICD-10-CM | POA: Diagnosis not present

## 2022-12-12 DIAGNOSIS — Z8269 Family history of other diseases of the musculoskeletal system and connective tissue: Secondary | ICD-10-CM

## 2022-12-12 DIAGNOSIS — M19041 Primary osteoarthritis, right hand: Secondary | ICD-10-CM | POA: Diagnosis not present

## 2022-12-12 DIAGNOSIS — M25572 Pain in left ankle and joints of left foot: Secondary | ICD-10-CM

## 2022-12-12 DIAGNOSIS — K1379 Other lesions of oral mucosa: Secondary | ICD-10-CM

## 2022-12-12 DIAGNOSIS — M25562 Pain in left knee: Secondary | ICD-10-CM

## 2022-12-12 NOTE — Patient Instructions (Addendum)
Exercises for Chronic Knee Pain Chronic knee pain is pain that lasts longer than 3 months. For most people with chronic knee pain, exercise and weight loss is an important part of treatment. Your health care provider may want you to focus on: Strengthening the muscles that support your knee. This can take pressure off your knee and lessen pain. Preventing knee stiffness. Maintaining or increasing how far you can move your knee. Losing weight (if this applies) to take pressure off your knee, decrease your risk for injury, and make it easier for you to exercise. Your health care provider will help you develop an exercise program that matches your needs and physical abilities. Below are simple, low-impact exercises you can do at home. Ask your health care provider or a physical therapist how often you should do your exercise program and how many times to repeat each exercise. General safety tips Follow these safety tips for exercising with chronic knee pain: Get your health care provider's approval before doing any exercises. Start slowly and stop any time an exercise causes pain. Do not exercise if your knee pain is flaring up. Warm up first. Stretching a cold muscle can cause an injury. Do 5-10 minutes of easy movement or light stretching before beginning your exercise routine. Do 5-10 minutes of low-impact activity (like walking or cycling) before starting strengthening exercises. Contact your health care provider any time you have pain during or after exercising. Exercise may cause discomfort but should not be painful. It is normal to be a little stiff or sore after exercising.  Stretching and range-of-motion exercises Front thigh stretch  Stand up straight and support your body by holding on to a chair or resting one hand on a wall. With your legs straight and close together, bend one knee to lift your heel up toward your buttocks. Using one hand for support, grab your ankle with your free  hand. Pull your foot up closer toward your buttocks to feel the stretch in front of your thigh. Hold the stretch for 30 seconds. Repeat __________ times. Complete this exercise __________ times a day. Back thigh stretch  Sit on the floor with your back straight and your legs out straight in front of you. Place the palms of your hands on the floor and slide them toward your feet as you bend at the hip. Try to touch your nose to your knees and feel the stretch in the back of your thighs. Hold for 30 seconds. Repeat __________ times. Complete this exercise __________ times a day. Calf stretch  Stand facing a wall. Place the palms of your hands flat against the wall, arms extended, and lean slightly against the wall. Get into a lunge position with one leg bent at the knee and the other leg stretched out straight behind you. Keep both feet facing the wall and increase the bend in your knee while keeping the heel of the other leg flat on the ground. You should feel the stretch in your calf. Hold for 30 seconds. Repeat __________ times. Complete this exercise __________ times a day. Strengthening exercises Straight leg lift Lie on your back with one knee bent and the other leg out straight. Slowly lift the straight leg without bending the knee. Lift until your foot is about 12 inches (30 cm) off the floor. Hold for 3-5 seconds and slowly lower your leg. Repeat __________ times. Complete this exercise __________ times a day. Single leg dip Stand between two chairs and put both hands on the  backs of the chairs for support. Extend one leg out straight with your body weight resting on the heel of the standing leg. Slowly bend your standing knee to dip your body to the level that is comfortable for you. Hold for 3-5 seconds. Repeat __________ times. Complete this exercise __________ times a day. Hamstring curls Stand straight, knees close together, facing the back of a chair. Hold on to the  back of a chair with both hands. Keep one leg straight. Bend the other knee while bringing the heel up toward the buttock until the knee is bent at a 90-degree angle (right angle). Hold for 3-5 seconds. Repeat __________ times. Complete this exercise __________ times a day. Wall squat Stand straight with your back, hips, and head against a wall. Step forward one foot at a time with your back still against the wall. Your feet should be 2 feet (61 cm) from the wall at shoulder width. Keeping your back, hips, and head against the wall, slide down the wall to as close of a sitting position as you can get. Hold for 5-10 seconds, then slowly slide back up. Repeat __________ times. Complete this exercise __________ times a day. Step-ups Step up with one foot onto a sturdy platform or stool that is about 6 inches (15 cm) high. Face sideways with one foot on the platform and one on the ground. Place all your weight on the platform foot and lift your body off the ground until your knee extends. Let your other leg hang free to the side. Hold for 3-5 seconds then slowly lower your weight down to the floor foot. Repeat __________ times. Complete this exercise __________ times a day. Contact a health care provider if: Your exercise causes pain. Your pain is worse after you exercise. Your pain prevents you from doing your exercises. This information is not intended to replace advice given to you by your health care provider. Make sure you discuss any questions you have with your health care provider. Document Revised: 02/19/2020 Document Reviewed: 10/13/2019 Elsevier Patient Education  [place].  Hand Exercises Hand exercises can be helpful for almost anyone. These exercises can strengthen the hands, improve flexibility and movement, and increase blood flow to the hands. These results can make work and daily tasks easier. Hand exercises can be especially helpful for people who have joint pain  from arthritis or have nerve damage from overuse (carpal tunnel syndrome). These exercises can also help people who have injured a hand. Exercises Most of these hand exercises are gentle stretching and motion exercises. It is usually safe to do them often throughout the day. Warming up your hands before exercise may help to reduce stiffness. You can do this with gentle massage or by placing your hands in warm water for 10-15 minutes. It is normal to feel some stretching, pulling, tightness, or mild discomfort as you begin new exercises. This will gradually improve. Stop an exercise right away if you feel sudden, severe pain or your pain gets worse. Ask your health care provider which exercises are best for you. Knuckle bend or "claw" fist  Stand or sit with your arm, hand, and all five fingers pointed straight up. Make sure to keep your wrist straight during the exercise. Gently bend your fingers down toward your palm until the tips of your fingers are touching the top of your palm. Keep your big knuckle straight and just bend the small knuckles in your fingers. Hold this position for __________ seconds. Straighten (  extend) your fingers back to the starting position. Repeat this exercise 5-10 times with each hand. Full finger fist  Stand or sit with your arm, hand, and all five fingers pointed straight up. Make sure to keep your wrist straight during the exercise. Gently bend your fingers into your palm until the tips of your fingers are touching the middle of your palm. Hold this position for __________ seconds. Extend your fingers back to the starting position, stretching every joint fully. Repeat this exercise 5-10 times with each hand. Straight fist Stand or sit with your arm, hand, and all five fingers pointed straight up. Make sure to keep your wrist straight during the exercise. Gently bend your fingers at the big knuckle, where your fingers meet your hand, and the middle knuckle. Keep  the knuckle at the tips of your fingers straight and try to touch the bottom of your palm. Hold this position for __________ seconds. Extend your fingers back to the starting position, stretching every joint fully. Repeat this exercise 5-10 times with each hand. Tabletop  Stand or sit with your arm, hand, and all five fingers pointed straight up. Make sure to keep your wrist straight during the exercise. Gently bend your fingers at the big knuckle, where your fingers meet your hand, as far down as you can while keeping the small knuckles in your fingers straight. Think of forming a tabletop with your fingers. Hold this position for __________ seconds. Extend your fingers back to the starting position, stretching every joint fully. Repeat this exercise 5-10 times with each hand. Finger spread  Place your hand flat on a table with your palm facing down. Make sure your wrist stays straight as you do this exercise. Spread your fingers and thumb apart from each other as far as you can until you feel a gentle stretch. Hold this position for __________ seconds. Bring your fingers and thumb tight together again. Hold this position for __________ seconds. Repeat this exercise 5-10 times with each hand. Making circles  Stand or sit with your arm, hand, and all five fingers pointed straight up. Make sure to keep your wrist straight during the exercise. Make a circle by touching the tip of your thumb to the tip of your index finger. Hold for __________ seconds. Then open your hand wide. Repeat this motion with your thumb and each finger on your hand. Repeat this exercise 5-10 times with each hand. Thumb motion  Sit with your forearm resting on a table and your wrist straight. Your thumb should be facing up toward the ceiling. Keep your fingers relaxed as you move your thumb. Lift your thumb up as high as you can toward the ceiling. Hold for __________ seconds. Bend your thumb across your palm as far  as you can, reaching the tip of your thumb for the small finger (pinkie) side of your palm. Hold for __________ seconds. Repeat this exercise 5-10 times with each hand. Grip strengthening  Hold a stress ball or other soft ball in the middle of your hand. Slowly increase the pressure, squeezing the ball as much as you can without causing pain. Think of bringing the tips of your fingers into the middle of your palm. All of your finger joints should bend when doing this exercise. Hold your squeeze for __________ seconds, then relax. Repeat this exercise 5-10 times with each hand. Contact a health care provider if: Your hand pain or discomfort gets much worse when you do an exercise. Your hand pain or  discomfort does not improve within 2 hours after you exercise. If you have any of these problems, stop doing these exercises right away. Do not do them again unless your health care provider says that you can. Get help right away if: You develop sudden, severe hand pain or swelling. If this happens, stop doing these exercises right away. Do not do them again unless your health care provider says that you can. This information is not intended to replace advice given to you by your health care provider. Make sure you discuss any questions you have with your health care provider. Document Revised: 02/03/2021 Document Reviewed: 02/03/2021 Elsevier Patient Education  [place].

## 2023-03-28 ENCOUNTER — Ambulatory Visit: Payer: 59 | Admitting: Family

## 2023-03-29 NOTE — Progress Notes (Signed)
Office Visit Note  Patient: Katie Thomas             Date of Birth: 12-18-1985           MRN: [id number]             PCP: Glori Luis, MD Referring: Glori Luis, MD Visit Date: 04/11/2023 Occupation: @GUAROCC @  Subjective:  Joint stiffness and fatigue  History of Present Illness: Katie Thomas is a 37 y.o. female with history of osteoarthritis and myofascial pain syndrome.  We discussed use of Cymbalta at the last visit due to myofascial pain and depression.  Patient states she started Cymbalta about a week ago and noticed remarkable improvement in her pain symptoms.  She has been taking Cymbalta 20 mg p.o. daily.  She does not have much joint pain now.  She is also noticed improvement in her depression.  She has noticed some improvement in her fatigue she has some stiffness in her hands and her knee joints especially when she climbs the stairs.  She has not noticed any joint swelling..  She continues to take vitamin D 15,000 units twice a week.  She continues to have dry mouth and dry eyes.  She states symptoms of dry mouth has been slightly worse on Cymbalta.    Activities of Daily Living:  Patient reports morning stiffness for 0 minutes.   Patient Denies nocturnal pain.  Difficulty dressing/grooming: Denies Difficulty climbing stairs: Reports Difficulty getting out of chair: Reports Difficulty using hands for taps, buttons, cutlery, and/or writing: Reports  Review of Systems  Constitutional:  Positive for fatigue.  HENT:  Positive for mouth sores and mouth dryness.   Eyes:  Negative for dryness.  Respiratory:  Negative for shortness of breath.   Cardiovascular:  Negative for chest pain and palpitations.  Gastrointestinal:  Positive for constipation. Negative for blood in stool and diarrhea.  Endocrine: Negative for increased urination.  Genitourinary:  Negative for involuntary urination.  Musculoskeletal:  Positive for gait problem. Negative for joint pain, joint  pain, joint swelling, myalgias, muscle weakness, morning stiffness, muscle tenderness and myalgias.  Skin:  Positive for rash, redness and sensitivity to sunlight. Negative for color change and hair loss.  Allergic/Immunologic: Negative for susceptible to infections.  Neurological:  Positive for light-headedness. Negative for dizziness and headaches.  Hematological:  Negative for swollen glands.  Psychiatric/Behavioral:  Positive for depressed mood. Negative for sleep disturbance. The patient is nervous/anxious.     PMFS History:  Patient Active Problem List   Diagnosis Date Noted   Chronic joint pain 05/04/2022   Labor and delivery, indication for care 10/17/2019   Thoracic back pain 02/19/2016   Anxiety and depression 02/19/2016   Abdominal pain 02/19/2016    Past Medical History:  Diagnosis Date   Depression    Eating disorder    Gallstones    Insulin resistance     Family History  Problem Relation Age of Onset   Mental illness Mother    Lupus Mother    Fibromyalgia Mother    Hypothyroidism Sister    Arthritis Sister    Diabetes Maternal Grandmother    COPD Maternal Grandfather    Heart disease Maternal Grandfather    Endometrial cancer Paternal Grandmother    Breast cancer Other    Heart disease Other    Mental illness Other    Diabetes Other    Healthy Daughter    Past Surgical History:  Procedure Laterality Date   CHOLECYSTECTOMY  2012  Social History   Social History Narrative   Not on file    There is no immunization history on file for this patient.   Objective: Vital Signs: BP 128/87 (BP Location: Left Arm, Patient Position: Sitting, Cuff Size: Large)   Pulse 93   Resp 15   Ht 5\' 4"  (1.626 m)   Wt 216 lb (98 kg)   BMI 37.08 kg/m    Physical Exam Vitals and nursing note reviewed.  Constitutional:      Appearance: She is well-developed.  HENT:     Head: Normocephalic and atraumatic.  Eyes:     Conjunctiva/sclera: Conjunctivae normal.   Cardiovascular:     Rate and Rhythm: Normal rate and regular rhythm.     Heart sounds: Normal heart sounds.  Pulmonary:     Effort: Pulmonary effort is normal.     Breath sounds: Normal breath sounds.  Abdominal:     General: Bowel sounds are normal.     Palpations: Abdomen is soft.  Musculoskeletal:     Cervical back: Normal range of motion.  Lymphadenopathy:     Cervical: No cervical adenopathy.  Skin:    General: Skin is warm and dry.     Capillary Refill: Capillary refill takes less than 2 seconds.  Neurological:     Mental Status: She is alert and oriented to person, place, and time.  Psychiatric:        Behavior: Behavior normal.      Musculoskeletal Exam: Cervical, thoracic and lumbar spine were in good range of motion.  Shoulder joints, elbow joints, wrist joints with good range of motion.  She had bilateral DIP thickening with no synovitis.  Hip joints and knee joints in good range of motion.  She had no tenderness over ankles or MTPs.  Bilateral pes cavus was noted.  There was no Achilles tendinitis of [place] fasciitis.  CDAI Exam: CDAI Score: -- Patient Global: --; Provider Global: -- Swollen: --; Tender: -- Joint Exam 04/11/2023   No joint exam has been documented for this visit   There is currently no information documented on the homunculus. Go to the Rheumatology activity and complete the homunculus joint exam.  Investigation: No additional findings.  Imaging: No results found.  Recent Labs: Lab Results  Component Value Date   WBC 6.7 11/21/2022   HGB 13.7 11/21/2022   PLT 209 11/21/2022   NA 141 11/21/2022   K 4.3 11/21/2022   CL 103 11/21/2022   CO2 29 11/21/2022   GLUCOSE 95 11/21/2022   BUN 10 11/21/2022   CREATININE 0.77 11/21/2022   BILITOT 0.2 11/21/2022   ALKPHOS 40 (L) 12/01/2011   AST 21 11/21/2022   ALT 14 11/21/2022   PROT 7.1 11/21/2022   PROT 6.9 11/21/2022   ALBUMIN 3.9 12/01/2011   CALCIUM 9.6 11/21/2022    Speciality  Comments: No specialty comments available.  Procedures:  No procedures performed Allergies: Patient has no known allergies.   Assessment / Plan:     Visit Diagnoses: Primary osteoarthritis of both hands - History of pain in bilateral hands since November 2022.  No synovitis was noted.  X-rays were unremarkable.  She had bilateral DIP thickening consistent with early osteoarthritic changes.  Patient has noticed improvement in her hand symptoms since she has been taking Cymbalta.  Chronic pain of both knees -she continues to have some discomfort when she climbs stairs.  She has been having discomfort in her knee joints for the last couple of years.  X-rays obtained at the initial visit showed mild bilateral chondromalacia patella.  No warmth swelling or effusion was noted.  Lower extremity muscle strength exercises were advised.    Chronic pain of both ankles -she has noticed improvement in her ankle discomfort.  She has not noticed any joint swelling recently.  She states in the past she had some Achilles tendon tenderness.  No synovitis was noted.  No tendinitis was noted.  Pain in both feet -she is not having much discomfort in her feet now.  Bilateral pes cavus and callus formation under the metatarsals was noted.  Proper fitting shoes were advised.  Myofascial pain-she has noticed remarkable improvement in her symptoms since she has been on Cymbalta 20 mg p.o. daily for the last week.  Need for regular exercise, water aerobics swimming and stretching were emphasized.  Rash - History of redness on face and nose.  All autoimmune workup was negative.  Recurrent oral ulcers - History of frequent oral ulcers.  All autoimmune workup negative.  Patient has an appointment coming up with the dermatologist.  Dry mouth - SSA negative, SSB negative, ANA negative, RF negative.  All autoimmune workup was negative.  Over-the-counter products were discussed.  Primary insomnia - better with cheery  juice.  Anxiety and depression-improved on Cymbalta.  Vitamin D deficiency-she is on vitamin D supplement.  She was advised to follow  vitamin D level with her PCP.  Family history of fibromyalgia-mother  Orders: No orders of the defined types were placed in this encounter.  No orders of the defined types were placed in this encounter.   Follow-Up Instructions: Return in about 1 year (around 04/10/2024) for Osteoarthritis.   Pollyann Savoy, MD  Note - This record has been created using Animal nutritionist.  Chart creation errors have been sought, but may not always  have been located. Such creation errors do not reflect on  the standard of medical care.

## 2023-04-04 ENCOUNTER — Ambulatory Visit: Payer: 59 | Admitting: Family

## 2023-04-11 ENCOUNTER — Ambulatory Visit: Payer: 59 | Attending: Rheumatology | Admitting: Rheumatology

## 2023-04-11 ENCOUNTER — Encounter: Payer: Self-pay | Admitting: Rheumatology

## 2023-04-11 VITALS — BP 128/87 | HR 93 | Resp 15 | Ht 64.0 in | Wt 216.0 lb

## 2023-04-11 DIAGNOSIS — M19041 Primary osteoarthritis, right hand: Secondary | ICD-10-CM | POA: Diagnosis not present

## 2023-04-11 DIAGNOSIS — F5101 Primary insomnia: Secondary | ICD-10-CM

## 2023-04-11 DIAGNOSIS — K1379 Other lesions of oral mucosa: Secondary | ICD-10-CM

## 2023-04-11 DIAGNOSIS — M7918 Myalgia, other site: Secondary | ICD-10-CM

## 2023-04-11 DIAGNOSIS — M19042 Primary osteoarthritis, left hand: Secondary | ICD-10-CM

## 2023-04-11 DIAGNOSIS — M25571 Pain in right ankle and joints of right foot: Secondary | ICD-10-CM | POA: Diagnosis not present

## 2023-04-11 DIAGNOSIS — M25562 Pain in left knee: Secondary | ICD-10-CM

## 2023-04-11 DIAGNOSIS — F32A Depression, unspecified: Secondary | ICD-10-CM

## 2023-04-11 DIAGNOSIS — M25561 Pain in right knee: Secondary | ICD-10-CM | POA: Diagnosis not present

## 2023-04-11 DIAGNOSIS — M25572 Pain in left ankle and joints of left foot: Secondary | ICD-10-CM

## 2023-04-11 DIAGNOSIS — R682 Dry mouth, unspecified: Secondary | ICD-10-CM

## 2023-04-11 DIAGNOSIS — R21 Rash and other nonspecific skin eruption: Secondary | ICD-10-CM

## 2023-04-11 DIAGNOSIS — M79671 Pain in right foot: Secondary | ICD-10-CM | POA: Diagnosis not present

## 2023-04-11 DIAGNOSIS — M79672 Pain in left foot: Secondary | ICD-10-CM

## 2023-04-11 DIAGNOSIS — Z8269 Family history of other diseases of the musculoskeletal system and connective tissue: Secondary | ICD-10-CM

## 2023-04-11 DIAGNOSIS — G8929 Other chronic pain: Secondary | ICD-10-CM

## 2023-04-11 DIAGNOSIS — E559 Vitamin D deficiency, unspecified: Secondary | ICD-10-CM

## 2023-04-11 DIAGNOSIS — F419 Anxiety disorder, unspecified: Secondary | ICD-10-CM

## 2023-04-19 ENCOUNTER — Telehealth: Payer: Self-pay | Admitting: Family Medicine

## 2023-04-19 NOTE — Telephone Encounter (Signed)
Called patient regarding Genetics Counsler, left call back number if wanting to schedule appointment.

## 2023-05-21 ENCOUNTER — Inpatient Hospital Stay: Payer: 59 | Attending: Genetic Counselor | Admitting: Licensed Clinical Social Worker

## 2023-05-21 ENCOUNTER — Inpatient Hospital Stay: Payer: 59

## 2023-05-21 ENCOUNTER — Encounter: Payer: Self-pay | Admitting: Licensed Clinical Social Worker

## 2023-05-21 ENCOUNTER — Other Ambulatory Visit: Payer: Self-pay

## 2023-05-21 DIAGNOSIS — Z807 Family history of other malignant neoplasms of lymphoid, hematopoietic and related tissues: Secondary | ICD-10-CM

## 2023-05-21 DIAGNOSIS — Z803 Family history of malignant neoplasm of breast: Secondary | ICD-10-CM

## 2023-05-21 DIAGNOSIS — Z8041 Family history of malignant neoplasm of ovary: Secondary | ICD-10-CM | POA: Diagnosis not present

## 2023-05-21 LAB — GENETIC SCREENING ORDER

## 2023-05-21 NOTE — Progress Notes (Signed)
REFERRING PROVIDER: Collene Mares, PA [address] [place],  [place] [address]  PRIMARY PROVIDER:  Glori Luis, MD  PRIMARY REASON FOR VISIT:  1. Family history of ovarian cancer   2. Family history of breast cancer      HISTORY OF PRESENT ILLNESS:   Katie Thomas, a 37 y.o. female, was seen for a [place] cancer genetics consultation at the request of Dr. Hyacinth Meeker due to a family history of ovarian cancer.  Katie Thomas presents to clinic today to discuss the possibility of a hereditary predisposition to cancer, genetic testing, and to further clarify her future cancer risks, as well as potential cancer risks for family members.   CANCER HISTORY:  Katie Thomas is a 37 y.o. female with no personal history of cancer.    RISK FACTORS:  Menarche was at age 17-12.  First live birth at age 44.  OCP use for approximately 10 years.  Ovaries intact: yes.  Hysterectomy: no.  Menopausal status: premenopausal.  HRT use: 0 years. Colonoscopy: no; not examined. Mammogram within the last year: no. Number of breast biopsies: 0. Up to date with pelvic exams: yes.   Past Medical History:  Diagnosis Date   Depression    Eating disorder    Gallstones    Insulin resistance     Past Surgical History:  Procedure Laterality Date   CHOLECYSTECTOMY  2012    FAMILY HISTORY:  We obtained a detailed, 4-generation family history.  Significant diagnoses are listed below: Family History  Problem Relation Age of Onset   Mental illness Mother    Lupus Mother    Fibromyalgia Mother    Hypothyroidism Sister    Arthritis Sister    Diabetes Maternal Grandmother    COPD Maternal Grandfather    Heart disease Maternal Grandfather    Ovarian cancer Paternal Grandmother 67   Healthy Daughter    Breast cancer Other    Lymphoma Other    Heart disease Other    Mental illness Other    Diabetes Other    Ovarian cancer Other    Katie Thomas has 1 daughter, 3, and 1 sister, 12. Her  sister had negative genetic testing for the BRCA1/BRCA2 genes. Her husband did 23 and me testing and was found to have 2 MUTYH variants and may contact us to do confirmation testing.  Katie Thomas mother is living at 85. Maternal grandfather has had multiple melanomas removed, he passed at 66, he also had sun exposure. Maternal great aunt had breast cancer at 41 and lymphoma.  Katie Thomas father is living at 51. Paternal grandmother had ovarian cancer and passed at 69. Her mother, patient's paternal great grandmother, died of ovarian cancer as well, possibly under 50 or in her 49s.   Katie Thomas is unaware of previous family history of genetic testing for hereditary cancer risks. There is no reported Ashkenazi Jewish ancestry. There is no known consanguinity.    GENETIC COUNSELING ASSESSMENT: Katie Thomas is a 37 y.o. female with a family history of ovarian cancer which is somewhat suggestive of a hereditary cancer syndrome and predisposition to cancer. We, therefore, discussed and recommended the following at today's visit.   DISCUSSION: We discussed that approximately 10-20% of ovarian cancer is hereditary. Most cases of hereditary ovarian cancer are associated with BRCA1/BRCA2 genes, although there are other genes associated with hereditary cancer as well. Cancers and risks are gene specific. We discussed that testing is beneficial for several reasons including knowing  about cancer risks, identifying potential screening and risk-reduction options that may be appropriate, and to understand if other family members could be at risk for cancer and allow them to undergo genetic testing.   We reviewed the characteristics, features and inheritance patterns of hereditary cancer syndromes. We also discussed genetic testing, including the appropriate family members to test, the process of testing, insurance coverage and turn-around-time for results. We discussed the implications of a negative, positive and/or  variant of uncertain significant result. We recommended Katie Thomas pursue genetic testing for the [employer] CancerNext-Expanded+RNA gene panel.   Based on Katie Thomas's family history of cancer, she meets medical criteria for genetic testing. Despite that she meets criteria, she may still have an out of pocket cost.   PLAN: After considering the risks, benefits, and limitations, Katie Thomas provided informed consent to pursue genetic testing and the blood sample was sent to [facility] for analysis of the CancerNext-Expanded+RNA panel. Results should be available within approximately 2-3 weeks' time, at which point they will be disclosed by telephone to Katie Thomas, as will any additional recommendations warranted by these results. Katie Thomas will receive a summary of her genetic counseling visit and a copy of her results once available. This information will also be available in Epic.   Katie Thomas questions were answered to her satisfaction today. Our contact information was provided should additional questions or concerns arise. Thank you for the referral and allowing [place] to share in the care of your patient.   Lacy Duverney, MS, [facility] Genetic Counselor [email or web address] Phone: [phone]  The patient was seen for a total of 35 minutes in face-to-face genetic counseling.  Dr. Blake Divine was available for discussion regarding this case.   _______________________________________________________________________ For Office Staff:  Number of people involved in session: 2 Was an Intern/ student involved with case: yes; [employer] intern Maddy Maisie Fus was present and assisted with this case.

## 2023-05-28 ENCOUNTER — Encounter: Payer: Self-pay | Admitting: Licensed Clinical Social Worker

## 2023-05-28 ENCOUNTER — Ambulatory Visit: Payer: Self-pay | Admitting: Licensed Clinical Social Worker

## 2023-05-28 ENCOUNTER — Telehealth: Payer: Self-pay | Admitting: Licensed Clinical Social Worker

## 2023-05-28 DIAGNOSIS — Z1379 Encounter for other screening for genetic and chromosomal anomalies: Secondary | ICD-10-CM | POA: Insufficient documentation

## 2023-05-28 NOTE — Telephone Encounter (Signed)
I contacted Ms. Apachito to discuss her genetic testing results. No pathogenic variants were identified in the 71 genes analyzed. Detailed clinic note to follow.   The test report has been scanned into EPIC and is located under the Molecular Pathology section of the Results Review tab.  A portion of the result report is included below for reference.      Lacy Duverney, MS, [facility] Genetic Counselor [email or web address] Phone: [phone]

## 2023-05-28 NOTE — Progress Notes (Signed)
HPI:   Katie Thomas was previously seen in the [facility] due to a family history of ovarian cancer and concerns regarding a hereditary predisposition to cancer. Please refer to our prior cancer genetics clinic note for more information regarding our discussion, assessment and recommendations, at the time. Katie Thomas recent genetic test results were disclosed to her, as were recommendations warranted by these results. These results and recommendations are discussed in more detail below.  CANCER HISTORY:  Oncology History   No history exists.    FAMILY HISTORY:  We obtained a detailed, 4-generation family history.  Significant diagnoses are listed below: Family History  Problem Relation Age of Onset   Mental illness Mother    Lupus Mother    Fibromyalgia Mother    Hypothyroidism Sister    Arthritis Sister    Diabetes Maternal Grandmother    COPD Maternal Grandfather    Heart disease Maternal Grandfather    Ovarian cancer Paternal Grandmother 68   Healthy Daughter    Breast cancer Other    Lymphoma Other    Heart disease Other    Mental illness Other    Diabetes Other    Ovarian cancer Other     Katie Thomas has 1 daughter, 3, and 1 sister, 57. Her sister had negative genetic testing for the BRCA1/BRCA2 genes. Her husband did 34 and me testing and was found to have 2 MUTYH variants and may contact us to do confirmation testing.   Katie Thomas mother is living at 9. Maternal grandfather has had multiple melanomas removed, he passed at 8, he also had sun exposure. Maternal great aunt had breast cancer at 41 and lymphoma.   Katie Thomas father is living at 33. Paternal grandmother had ovarian cancer and passed at 52. Her mother, patient's paternal great grandmother, died of ovarian cancer as well, possibly under 50 or in her 29s.    Katie Thomas is unaware of previous family history of genetic testing for hereditary cancer risks. There is no reported Ashkenazi  Jewish ancestry. There is no known consanguinity.     GENETIC TEST RESULTS:  The [facility] CancerNext-Expanded+RNA Panel found no pathogenic mutations.   The CancerNext-Expanded + RNAinsight gene panel offered by [employer] and includes sequencing and rearrangement analysis for the following 71 genes: AIP, ALK, APC*, ATM*, AXIN2, BAP1, BARD1, BMPR1A, BRCA1*, BRCA2*, BRIP1*, CDC73, CDH1*,CDK4, CDKN1B, CDKN2A, CHEK2*, CTNNA1, DICER1, FH, FLCN, KIF1B, LZTR1, MAX, MEN1, MET, MLH1*, MSH2*, MSH3, MSH6*, MUTYH*, NF1*, NF2, NTHL1, PALB2*, PHOX2B, PMS2*, POT1, PRKAR1A, PTCH1, PTEN*, RAD51C*, RAD51D*,RB1, RET, SDHA, SDHAF2, SDHB, SDHC, SDHD, SMAD4, SMARCA4, SMARCB1, SMARCE1, STK11, SUFU, TMEM127, TP53*,TSC1, TSC2, VHL; EGFR, EGLN1, HOXB13, KIT, MITF, PDGFRA, POLD1 and POLE (sequencing only); EPCAM and GREM1 (deletion/duplication only).   The test report has been scanned into EPIC and is located under the Molecular Pathology section of the Results Review tab.  A portion of the result report is included below for reference. Genetic testing reported out on 05/26/2023.      Even though a pathogenic variant was not identified, possible explanations for the cancer in the family may include: There may be no hereditary risk for cancer in the family. The cancers in Katie Thomas and/or her family may be sporadic/familial or due to other genetic and environmental factors. There may be a gene mutation in one of these genes that current testing methods cannot detect but that chance is small. There could be another gene that has not yet been discovered, or that we  have not yet tested, that is responsible for the cancer diagnoses in the family.  It is also possible there is a hereditary cause for the cancer in the family that Katie Thomas did not inherit.  Therefore, it is important to remain in touch with cancer genetics in the future so that we can continue to offer Katie Thomas the most up to date genetic testing.    ADDITIONAL GENETIC TESTING:  We discussed with Katie Thomas that her genetic testing was fairly extensive.  If there are additional relevant genes identified to increase cancer risk that can be analyzed in the future, we would be happy to discuss and coordinate this testing at that time.    CANCER SCREENING RECOMMENDATIONS:  Katie Thomas test result is considered negative (normal).  This means that we have not identified a hereditary cause for her family history of cancer at this time.   An individual's cancer risk and medical management are not determined by genetic test results alone. Overall cancer risk assessment incorporates additional factors, including personal medical history, family history, and any available genetic information that may result in a personalized plan for cancer prevention and surveillance. Therefore, it is recommended she continue to follow the cancer management and screening guidelines provided by her primary healthcare provider.  RECOMMENDATIONS FOR FAMILY MEMBERS:   Since she did not inherit a identifiable mutation in a cancer predisposition gene included on this panel, her children could not have inherited a known mutation from her in one of these genes. Individuals in this family might be at some increased risk of developing cancer, over the general population risk, due to the family history of cancer.  Individuals in the family should notify their providers of the family history of cancer. We recommend women in this family have a yearly mammogram beginning at age 15, or 29 years younger than the earliest onset of cancer, an annual clinical breast exam, and perform monthly breast self-exams.  Family members should have colonoscopies by at age 75, or earlier, as recommended by their providers. Other members of the family may still carry a pathogenic variant in one of these genes that Katie Thomas did not inherit. Based on the family history, we recommend her paternal  relatives have genetic counseling and testing. Katie Thomas will let us know if we can be of any assistance in coordinating genetic counseling and/or testing for this family member.     FOLLOW-UP:  Lastly, we discussed with Katie Thomas that cancer genetics is a rapidly advancing field and it is possible that new genetic tests will be appropriate for her and/or her family members in the future. We encouraged her to remain in contact with cancer genetics on an annual basis so we can update her personal and family histories and let her know of advances in cancer genetics that may benefit this family.   Our contact number was provided. Katie Thomas questions were answered to her satisfaction, and she knows she is welcome to call us at anytime with additional questions or concerns.    Lacy Duverney, MS, [facility] Genetic Counselor [email or web address] Phone: [phone]

## 2023-06-15 ENCOUNTER — Encounter: Payer: Self-pay | Admitting: Sports Medicine

## 2023-06-15 ENCOUNTER — Ambulatory Visit: Payer: 59 | Admitting: Sports Medicine

## 2023-06-15 DIAGNOSIS — G8929 Other chronic pain: Secondary | ICD-10-CM

## 2023-06-15 DIAGNOSIS — M7989 Other specified soft tissue disorders: Secondary | ICD-10-CM | POA: Diagnosis not present

## 2023-06-15 DIAGNOSIS — F419 Anxiety disorder, unspecified: Secondary | ICD-10-CM

## 2023-06-15 DIAGNOSIS — M25572 Pain in left ankle and joints of left foot: Secondary | ICD-10-CM

## 2023-06-15 DIAGNOSIS — F32A Depression, unspecified: Secondary | ICD-10-CM

## 2023-06-15 DIAGNOSIS — M25571 Pain in right ankle and joints of right foot: Secondary | ICD-10-CM

## 2023-06-15 DIAGNOSIS — M79641 Pain in right hand: Secondary | ICD-10-CM

## 2023-06-15 DIAGNOSIS — M797 Fibromyalgia: Secondary | ICD-10-CM | POA: Diagnosis not present

## 2023-06-15 DIAGNOSIS — Z8269 Family history of other diseases of the musculoskeletal system and connective tissue: Secondary | ICD-10-CM

## 2023-06-15 DIAGNOSIS — M222X2 Patellofemoral disorders, left knee: Secondary | ICD-10-CM

## 2023-06-15 DIAGNOSIS — M79642 Pain in left hand: Secondary | ICD-10-CM

## 2023-06-15 DIAGNOSIS — M222X1 Patellofemoral disorders, right knee: Secondary | ICD-10-CM

## 2023-06-15 NOTE — Progress Notes (Signed)
Katie Thomas - 37 y.o. female MRN [id number]  Date of birth: 29-Oct-1986  Office Visit Note: Visit Date: 06/15/2023 PCP: Glori Luis, MD Referred by: Glori Luis, MD  Subjective: Chief Complaint  Patient presents with   Right Hand - Edema, Pain   Left Hand - Edema   Right Ankle - Edema, Pain   Left Ankle - Edema, Pain   HPI: Katie Thomas is a very pleasant 37 y.o. female who presents today for bilateral hand pain and swelling, bilateral knee pain, bilateral ankle pain.  Initial symptoms started a few months after the birth of her child in May 2021.  She has pain in bilateral hands and wrists, bilateral knees as well as bilateral ankles.  She does follow with rheumatology, was started on Cymbalta 20 mg and does notice rather good improvement in her pain and symptoms.  Joint pain is not 100% resolved but certainly improved.  Also notices an improvement in her depression with this.  She does note she is very sensitive to medication, she is having difficulty taking the vitamin D supplementation.  Previously she was having swelling in both of her hands and all fingers, more so as of late is in the index finger, right greater than left.  She is right-hand dominant.  She was tested for rheumatologic conditions back in January but most of these came back negative.  She did have a notable ESR of 22.  She presents today for further management and for more of a diagnosis to see if this is any inflammatory arthropathy or what is the exact cause.  Has not had any recent flares of her pain or swelling.  Patient's mother has a positive ANA and a history of fibromyalgia.  Independent note reviewed from Dr. Corliss Skains, rheumatology, note reviewed from 1/23, 2/13, 04/11/2023.  Diagnosed with history of arthritis, joint stiffness and myofascial pain syndrome.  Was started on Cymbalta 20 mg daily.  Labs from 11/21/22 were reviewed: November 21, 2022 ANA negative, ENA (double-stranded DNA, SSA, SSB,  Smith, RNP, SCL 70) negative, C3-C4 normal, beta-2 GP 1 negative, RF negative, anti-CCP negative, ESR 22, SPEP normal, CK106, TSH normal, vitamin D 18   Pertinent ROS were reviewed with the patient and found to be negative unless otherwise specified above in HPI.   Assessment & Plan: Visit Diagnoses:  1. Fibromyalgia   2. Bilateral hand pain   3. Chronic pain of both ankles   4. Chronic pain of both knees   5. Bilateral hand swelling   6. Family history of fibromyalgia   7. Anxiety and depression    Plan: Discussed with Nohea that after review of all of her blood work and previous imaging, I do think her pain is somewhat multifactorial.  She does meet diagnostic criteria with previous and current symptoms for fibromyalgia.  I do think this is contributing to her bilateral overall joint pains.  She did have a mildly elevated ESR (22) in the past and some of her swelling in her hands could be indicative of an occult rheumatologic condition, but currently does not favor this.  It is reassuring that her symptoms have vastly improved with Cymbalta 20 mg, this does fit more of a fibromyalgia or myofascial pain syndrome.  She is on a low-dose of 20 mg, we discussed increasing this up to 40 mg with a slow taper over the next 2 weeks.  She will think about this and can discuss this with Dr. Corliss Skains, but I do think  it would be helpful and still a relatively low dose without side effects.  We discussed that sometimes rheumatologic conditions can remain dormant, she will let me know if she has a flare or exacerbation, specifically if she has any recurrence of swelling and/or pain in the hand and wrist, at that time we will likely bring her in for an office visit or at least obtain updated lab work with inflammatory markers during that time.  In the meantime, she will continue to work on physical activity, did discuss it is safe for her to get back into the gym with lifting activity.  She will continue working  on good sleep hygiene, stress reduction and overall care.  She may benefit from orthotics or insoles given her pes cavus and supinated gait.  I did review and will send her patellofemoral and VMO strengthening exercises for both of her knees through MyChart for her to perform once daily.  She will follow-up with me as needed, may call or message if she is having an exacerbation with appropriate workup as above.  Follow-up: Return if symptoms worsen or fail to improve, for will call if experiencing flare.   Meds & Orders: No orders of the defined types were placed in this encounter.  No orders of the defined types were placed in this encounter.    Procedures: No procedures performed      Clinical History: No specialty comments available.  She reports that she has never smoked. She has never been exposed to tobacco smoke. She has never used smokeless tobacco. No results for input(s): "HGBA1C", "LABURIC" in the last 8760 hours.  Lab Results  Component Value Date   ESRSEDRATE 22 (H) 11/21/2022   Lab Results  Component Value Date   TSH 3.09 11/21/2022   Lab Results  Component Value Date   CKTOTAL 106 11/21/2022        Component Ref Range & Units 6 mo ago  Cyclic Citrullin Peptide Ab UNITS <16  Comment: Reference Range Negative:            <20 Weak Positive:       20-39 Moderate Positive:   40-59 Strong Positive:     >59        Component Ref Range & Units 6 mo ago  ds DNA Ab IU/mL 1  Comment:                            IU/mL       Interpretation                            < or = 4    Negative                            5-9         Indeterminate                            > or = 10   Positive     Lab Results  Component Value Date   RF <14 11/21/2022    Objective:    Physical Exam  Gen: Well-appearing, in no acute distress; non-toxic CV: Well-perfused. Warm.  Resp: Breathing unlabored on room air; no wheezing. Psych: Fluid speech in conversation; appropriate  affect; normal thought process Neuro: Sensation intact throughout.  No gross coordination deficits.   Ortho Exam - Bilateral hands: No redness swelling or effusion.  There is some stiffness with very mild soft tissue swelling over the PIP of the right index finger with about 8 degrees less of flexion with close fist.  Otherwise full range of motion at all joints of the fingers and wrist.  No dactylitis.  - Bilateral knees: Positive J sign left greater than right.  There is bilateral patellar crepitus.  Lesser predominant VMO bilaterally.  Range of motion is preserved from 0-135 degrees.  Ligamentously stable.  - Bilateral feet: Patient does have pes cavus feet.  Gait analysis does show oversupination through walking phase with pressure more so over the lateral column of the foot.  Heel strike dominant.  No swelling or effusion of either ankles.  Imaging:  *I did review her imaging for b/l feet, knees, and hands today - discussed with patient in room. XR Foot 2 Views Left No MTP PIP, DIP, intertarsal, tibiotalar, subtalar joint space narrowing  was noted.  No erosive changes were noted.  Impression: Unremarkable x-rays of the foot. XR Foot 2 Views Right No MTP PIP or DIP narrowing was noticed.  No intertarsal, tibiotalar or  subtalar joint space narrowing was noted.  Small inferior calcaneal spur  was noted.  Impression: Unremarkable x-rays of the foot except for a small inferior  calcaneal spur. XR KNEE 3 VIEW LEFT No medial or lateral compartment narrowing was noted.  Mild patellofemoral  narrowing was noted.  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 XR KNEE 3 VIEW RIGHT No medial or lateral compartment narrowing was noted.  Mild patellofemoral  narrowing was noted.  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 XR Hand 2 View Left No CMC, MCP, PIP, DIP, intercarpal or radiocarpal joint space narrowing  was noted.  No erosive  changes were noted.  Impression: Unremarkable x-rays of the hand. XR Hand 2 View Right No CMC, MCP, PIP or DIP narrowing was noted.  No intercarpal or  radiocarpal joint space narrowing was noted.  No erosive changes were  noted.  Impression: Unremarkable x-rays of the hand.    Past Medical/Family/Surgical/Social History: Medications & Allergies reviewed per EMR, new medications updated. Patient Active Problem List   Diagnosis Date Noted   Genetic testing 05/28/2023   Chronic joint pain 05/04/2022   Labor and delivery, indication for care 10/17/2019   Thoracic back pain 02/19/2016   Anxiety and depression 02/19/2016   Abdominal pain 02/19/2016   Past Medical History:  Diagnosis Date   Depression    Eating disorder    Gallstones    Insulin resistance    Family History  Problem Relation Age of Onset   Mental illness Mother    Lupus Mother    Fibromyalgia Mother    Hypothyroidism Sister    Arthritis Sister    Diabetes Maternal Grandmother    COPD Maternal Grandfather    Heart disease Maternal Grandfather    Ovarian cancer Paternal Grandmother 62   Healthy Daughter    Breast cancer Other    Lymphoma Other    Heart disease Other    Mental illness Other    Diabetes Other    Ovarian cancer Other    Past Surgical History:  Procedure Laterality Date   CHOLECYSTECTOMY  2012   Social History   Occupational History   Not on file  Tobacco Use   Smoking status: Never    Passive exposure: Never   Smokeless  tobacco: Never  Vaping Use   Vaping status: Never Used  Substance and Sexual Activity   Alcohol use: Yes    Comment: 1 monthly   Drug use: No   Sexual activity: Not on file   I spent 47 minutes in the care of the patient today including face-to-face time, preparation to see the patient, as well as independent review of all labs from 11/21/2022, review and interpretation of left foot, right foot, left knee, right knee, left hand, right hand x-ray from 11/21/2022,  independent chart review from Dr. Norlene Campbell and Dr. Corliss Skains, time spent administering and reviewing clinical fibromyalgia diagnostic criteria, counseling and educating the patient on home exercise plan for bilateral knees as well as lifestyle treatment options for the above diagnoses.   Madelyn Brunner, DO Primary Care Sports Medicine Physician  [facility]  This note was dictated using Dragon naturally speaking software and may contain errors in syntax, spelling, or content which have not been identified prior to signing this note.

## 2024-01-10 ENCOUNTER — Ambulatory Visit: Payer: 59 | Admitting: Sports Medicine

## 2024-02-11 ENCOUNTER — Ambulatory Visit: Payer: Self-pay

## 2024-02-11 NOTE — Telephone Encounter (Signed)
  Chief Complaint: joint pain Symptoms: bilateral feet/ankle pain, swelling in ankles, numbness and tingling in bilateral feet, bilateral hands/fingers/wrists pain Frequency: x years, worsening x 6 months Pertinent Negatives: Patient denies fever Disposition: [] ED /[] Urgent Care (no appt availability in office) / [] Appointment(In office/virtual)/ []  [facility]/ [] Home Care/ [] Refused Recommended Disposition /[] [facility]/ [x]  Follow-up with PCP Additional Notes: Patient states she is on a medication (Cymbalta) that masks the pain but she feels break through pain. She feels it is worse in her feet. She states she would like to have her HA1C checked. Patient states she was calling in thinking this was the office for [place]  Annabell Key, who is established with [employer]. Advised patient to call their office and be seen within 24 hours for symptoms.  Copied from CRM [id number]. Topic: Clinical - Red Word Triage >> Feb 11, 2024  8:46 AM Bearl Botts A wrote: Red Word that prompted transfer to Nurse Triage: Patient is having pain in her heels, feet, joints, wrist. Reason for Disposition  Numbness (i.e., loss of sensation) in foot or toes  (Exception: Just tingling; numbness present > 2 weeks.)  Answer Assessment - Initial Assessment Questions 1. ONSET: "When did the pain start?"      Several years. Worsened x 6 months.  2. LOCATION: "Where is the pain located?"      Bilateral feet, "bottoms and tops of feet, ankles, achilles tendons"  3. PAIN: "How bad is the pain?"    (Scale 1-10; or mild, moderate, severe)  - MILD (1-3): doesn't interfere with normal activities.   - MODERATE (4-7): interferes with normal activities (e.g., work or school) or awakens from sleep, limping.   - SEVERE (8-10): excruciating pain, unable to do any normal activities, unable to walk.      3/10.  4. WORK OR EXERCISE: "Has there been any recent work or exercise that involved this part of the body?"       She states working in the yard and being in the sun makes it worse and it causes a rash.   5. CAUSE: "What do you think is causing the foot pain?"     Unsure, she states she had a referral to a rheumatologist but most labs came back normal. She states arthritis in fingers and knees.  6. OTHER SYMPTOMS: "Do you have any other symptoms?" (e.g., leg pain, rash, fever, numbness)     Bilateral hands/fingers/wrists pain.  7. PREGNANCY: "Is there any chance you are pregnant?" "When was your last menstrual period?"     LMP: 2 weeks ago.  Protocols used: Foot Pain-A-AH

## 2024-02-29 ENCOUNTER — Ambulatory Visit: Admitting: Sports Medicine

## 2024-04-01 NOTE — Progress Notes (Deleted)
 Office Visit Note  Patient: Katie Thomas             Date of Birth: January 08, 1986           MRN: [id number]             PCP: Kent Pear, MD (Inactive) Referring: Kent Pear, MD Visit Date: 04/15/2024 Occupation: @GUAROCC @  Subjective:  No chief complaint on file.   History of Present Illness: Katie Thomas is a 38 y.o. female ***     Activities of Daily Living:  Patient reports morning stiffness for *** {minute/hour:19697}.   Patient {ACTIONS;DENIES/REPORTS:21021675::"Denies"} nocturnal pain.  Difficulty dressing/grooming: {ACTIONS;DENIES/REPORTS:21021675::"Denies"} Difficulty climbing stairs: {ACTIONS;DENIES/REPORTS:21021675::"Denies"} Difficulty getting out of chair: {ACTIONS;DENIES/REPORTS:21021675::"Denies"} Difficulty using hands for taps, buttons, cutlery, and/or writing: {ACTIONS;DENIES/REPORTS:21021675::"Denies"}  No Rheumatology ROS completed.   PMFS History:  Patient Active Problem List   Diagnosis Date Noted   Genetic testing 05/28/2023   Chronic joint pain 05/04/2022   Labor and delivery, indication for care 10/17/2019   Thoracic back pain 02/19/2016   Anxiety and depression 02/19/2016   Abdominal pain 02/19/2016    Past Medical History:  Diagnosis Date   Depression    Eating disorder    Gallstones    Insulin resistance     Family History  Problem Relation Age of Onset   Mental illness Mother    Lupus Mother    Fibromyalgia Mother    Hypothyroidism Sister    Arthritis Sister    Diabetes Maternal Grandmother    COPD Maternal Grandfather    Heart disease Maternal Grandfather    Ovarian cancer Paternal Grandmother 32   Healthy Daughter    Breast cancer Other    Lymphoma Other    Heart disease Other    Mental illness Other    Diabetes Other    Ovarian cancer Other    Past Surgical History:  Procedure Laterality Date   CHOLECYSTECTOMY  2012   Social History   Social History Narrative   Not on file    There is no  immunization history on file for this patient.   Objective: Vital Signs: There were no vitals taken for this visit.   Physical Exam   Musculoskeletal Exam: ***  CDAI Exam: CDAI Score: -- Patient Global: --; Provider Global: -- Swollen: --; Tender: -- Joint Exam 04/15/2024   No joint exam has been documented for this visit   There is currently no information documented on the homunculus. Go to the Rheumatology activity and complete the homunculus joint exam.  Investigation: No additional findings.  Imaging: No results found.  Recent Labs: Lab Results  Component Value Date   WBC 6.7 11/21/2022   HGB 13.7 11/21/2022   PLT 209 11/21/2022   NA 141 11/21/2022   K 4.3 11/21/2022   CL 103 11/21/2022   CO2 29 11/21/2022   GLUCOSE 95 11/21/2022   BUN 10 11/21/2022   CREATININE 0.77 11/21/2022   BILITOT 0.2 11/21/2022   ALKPHOS 40 (L) 12/01/2011   AST 21 11/21/2022   ALT 14 11/21/2022   PROT 7.1 11/21/2022   PROT 6.9 11/21/2022   ALBUMIN 3.9 12/01/2011   CALCIUM 9.6 11/21/2022    Speciality Comments: No specialty comments available.  Procedures:  No procedures performed Allergies: Patient has no known allergies.   Assessment / Plan:     Visit Diagnoses: No diagnosis found.  Orders: No orders of the defined types were placed in this encounter.  No orders of the defined types were placed  in this encounter.   Face-to-face time spent with patient was *** minutes. Greater than 50% of time was spent in counseling and coordination of care.  Follow-Up Instructions: No follow-ups on file.   Dee Farber, CMA  Note - This record has been created using Animal nutritionist.  Chart creation errors have been sought, but may not always  have been located. Such creation errors do not reflect on  the standard of medical care.

## 2024-04-09 ENCOUNTER — Ambulatory Visit (HOSPITAL_BASED_OUTPATIENT_CLINIC_OR_DEPARTMENT_OTHER): Admitting: Orthopaedic Surgery

## 2024-04-09 ENCOUNTER — Ambulatory Visit (HOSPITAL_BASED_OUTPATIENT_CLINIC_OR_DEPARTMENT_OTHER)

## 2024-04-09 DIAGNOSIS — G8929 Other chronic pain: Secondary | ICD-10-CM

## 2024-04-09 DIAGNOSIS — M25571 Pain in right ankle and joints of right foot: Secondary | ICD-10-CM

## 2024-04-09 DIAGNOSIS — M25572 Pain in left ankle and joints of left foot: Secondary | ICD-10-CM

## 2024-04-09 NOTE — Addendum Note (Signed)
 Addended by: Albesa Huguenin on: 04/09/2024 03:03 PM   Modules accepted: Orders

## 2024-04-09 NOTE — Progress Notes (Signed)
 Chief Complaint: Bilateral foot and ankle pain     History of Present Illness:    Katie Thomas is a 38 y.o. female presents today with bilateral Achilles pain as well as ankle pain.  She states that she been having pain and swelling in both ankles and hands approximately 3 years ago which she did notice rapid onset of swelling and pain.  Since that time she has been wearing shoe inserts.  She has been started on Cymbalta for possible fibromyalgia without any significant relief.  She is here today for further discussion of both feet and ankle    PMH/PSH/Family History/Social History/Meds/Allergies:    Past Medical History:  Diagnosis Date   Depression    Eating disorder    Gallstones    Insulin resistance    Past Surgical History:  Procedure Laterality Date   CHOLECYSTECTOMY  2012   Social History   Socioeconomic History   Marital status: Married    Spouse name: Not on file   Number of children: Not on file   Years of education: Not on file   Highest education level: Not on file  Occupational History   Not on file  Tobacco Use   Smoking status: Never    Passive exposure: Never   Smokeless tobacco: Never  Vaping Use   Vaping status: Never Used  Substance and Sexual Activity   Alcohol use: Yes    Comment: 1 monthly   Drug use: No   Sexual activity: Not on file  Other Topics Concern   Not on file  Social History Narrative   Not on file   Social Drivers of Health   Financial Resource Strain: Not on file  Food Insecurity: Not on file  Transportation Needs: Not on file  Physical Activity: Not on file  Stress: Not on file  Social Connections: Not on file   Family History  Problem Relation Age of Onset   Mental illness Mother    Lupus Mother    Fibromyalgia Mother    Hypothyroidism Sister    Arthritis Sister    Diabetes Maternal Grandmother    COPD Maternal Grandfather    Heart disease Maternal Grandfather    Ovarian cancer Paternal Grandmother 12    Healthy Daughter    Breast cancer Other    Lymphoma Other    Heart disease Other    Mental illness Other    Diabetes Other    Ovarian cancer Other    No Known Allergies Current Outpatient Medications  Medication Sig Dispense Refill   CYMBALTA 20 MG capsule Take 20 mg by mouth daily.     VITAMIN D  PO Take 15,000 Units by mouth daily. With vitamin K2     No current facility-administered medications for this visit.   No results found.  Review of Systems:   A ROS was performed including pertinent positives and negatives as documented in the HPI.  Physical Exam :   Constitutional: NAD and appears stated age Neurological: Alert and oriented Psych: Appropriate affect and cooperative unknown if currently breastfeeding.   Comprehensive Musculoskeletal Exam:    Tenderness about the Achilles bilaterally with tight pinching.  There is pain with passive dorsiflexion of the foot and ankle.  There is trace swelling about lateral malleolar eye bilaterally.   Imaging:   Xray (3 views right ankle, 3 views left ankle): Positive Haglund deformity bilaterally     I personally reviewed and interpreted the radiographs.   Assessment and Plan:   38  y.o. 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  She is also having some ankle swelling consistent with possible joint etiology.  At this time she has trialed activity restriction and shoe lifts without any relief.  Given this I do believe she would benefit from bilateral MRIs of her ankles.  Will plan to proceed with this and I will see her back following discuss results  -Plan for MRI bilateral ankles involved discussed results   I personally saw and evaluated the patient, and participated in the management and treatment plan.  Wilhelmenia Harada, MD Attending Physician, Orthopedic Surgery  This document was dictated using Dragon voice recognition software. A reasonable attempt at proof reading has  been made to minimize errors.

## 2024-04-10 ENCOUNTER — Other Ambulatory Visit (HOSPITAL_BASED_OUTPATIENT_CLINIC_OR_DEPARTMENT_OTHER): Payer: Self-pay | Admitting: Orthopaedic Surgery

## 2024-04-15 ENCOUNTER — Ambulatory Visit: Payer: 59 | Admitting: Rheumatology

## 2024-04-15 DIAGNOSIS — F419 Anxiety disorder, unspecified: Secondary | ICD-10-CM

## 2024-04-15 DIAGNOSIS — R21 Rash and other nonspecific skin eruption: Secondary | ICD-10-CM

## 2024-04-15 DIAGNOSIS — E559 Vitamin D deficiency, unspecified: Secondary | ICD-10-CM

## 2024-04-15 DIAGNOSIS — M19041 Primary osteoarthritis, right hand: Secondary | ICD-10-CM

## 2024-04-15 DIAGNOSIS — G8929 Other chronic pain: Secondary | ICD-10-CM

## 2024-04-15 DIAGNOSIS — F5101 Primary insomnia: Secondary | ICD-10-CM

## 2024-04-15 DIAGNOSIS — R682 Dry mouth, unspecified: Secondary | ICD-10-CM

## 2024-04-15 DIAGNOSIS — K1379 Other lesions of oral mucosa: Secondary | ICD-10-CM

## 2024-04-15 DIAGNOSIS — M7918 Myalgia, other site: Secondary | ICD-10-CM

## 2024-04-15 DIAGNOSIS — M79671 Pain in right foot: Secondary | ICD-10-CM

## 2024-04-15 DIAGNOSIS — Z8269 Family history of other diseases of the musculoskeletal system and connective tissue: Secondary | ICD-10-CM

## 2024-04-26 ENCOUNTER — Ambulatory Visit
Admission: RE | Admit: 2024-04-26 | Discharge: 2024-04-26 | Disposition: A | Discharge status: Home / Self Care | Source: Ambulatory Visit | Attending: Orthopaedic Surgery | Admitting: Orthopaedic Surgery

## 2024-04-26 DIAGNOSIS — G8929 Other chronic pain: Secondary | ICD-10-CM

## 2024-05-01 ENCOUNTER — Encounter (HOSPITAL_BASED_OUTPATIENT_CLINIC_OR_DEPARTMENT_OTHER): Payer: Self-pay | Admitting: Orthopaedic Surgery

## 2024-05-20 ENCOUNTER — Ambulatory Visit (INDEPENDENT_AMBULATORY_CARE_PROVIDER_SITE_OTHER): Admitting: Orthopedic Surgery

## 2024-05-20 ENCOUNTER — Other Ambulatory Visit: Payer: Self-pay

## 2024-05-20 DIAGNOSIS — M79641 Pain in right hand: Secondary | ICD-10-CM | POA: Diagnosis not present

## 2024-05-20 DIAGNOSIS — M79642 Pain in left hand: Secondary | ICD-10-CM

## 2024-05-20 NOTE — Progress Notes (Signed)
 Katie Thomas - 38 y.o. female MRN [id number]  Date of birth: 03/02/1986  Office Visit Note: Visit Date: 05/20/2024 PCP: Maribeth Camellia MATSU, MD (Inactive) Referred by: No ref. provider found  Subjective: No chief complaint on file.  HPI: Katie Thomas is a pleasant 38 y.o. female who presents today for bilateral hand pain with associated tingling that is intermittent in nature.  She has a complex medical history, is followed by rheumatology as well.  She has not undergone any formalized treatments for the hands as of yet.  She is here today for specific hand surgical evaluation.  Pertinent ROS were reviewed with the patient and found to be negative unless otherwise specified above in HPI.   Visit Reason: bilateral hand, wrist and finger pain, painful to touch Duration of symptoms: 2 years Hand dominance: right Occupation: stay at home mom Diabetic: No Smoking: No Heart/Lung History: none Blood Thinners: none  Prior Testing/EMG: xrays  Injections (Date): none Treatments: none Prior Surgery: none  Been told she has arthitis Twisting and pulling is painful  Tingling but no numbness Positive for antiphosolipid antibody  Assessment & Plan: Visit Diagnoses:  1. Bilateral hand pain     Plan: Extensive discussion was had with patient today regarding her bilateral hand pain and her intermittent swelling.  This could be a number of possibilities, particularly in the setting of potential autoimmune condition.  We did discuss the possibility of bilateral carpal tunnel syndrome.  She does have some clinical examination today findings that could suggest ongoing bilateral carpal tunnel syndrome.  Given that she has not undergone any conservative treatments, I would like to begin with bilateral wrist bracing particular for nocturnal symptoms and begin OT for nerve gliding exercises with transition to home exercise when appropriate.  We discussed etiology and pathophysiology of carpal  tunnel syndrome.  I did also explain that her nerve function currently is appropriate from a clinical standpoint, I do not see any urgent need for electrodiagnostic studies at this time.  She will follow-up with myself in approximate 6 to 8 weeks for recheck.  Should her symptoms continue and remain refractory to conservative measures, we could consider electrodiagnostic study in the future.  She expressed full understanding today.  Bilateral wrist braces were provided.  OT referral was placed.  I spent 45 minutes in the care of this patient today including review of previous documentation, imaging obtained, face-to-face time discussing all options regarding treatment and documenting the encounter.   Follow-up: No follow-ups on file.   Meds & Orders: No orders of the defined types were placed in this encounter.   Orders Placed This Encounter  Procedures   XR Hand Complete Left   XR Hand Complete Right   Ambulatory referral to Occupational Therapy     Procedures: No procedures performed      Clinical History: No specialty comments available.  She reports that she has never smoked. She has never been exposed to tobacco smoke. She has never used smokeless tobacco. No results for input(s): HGBA1C, LABURIC in the last 8760 hours.  Objective:   Vital Signs: There were no vitals taken for this visit.  Physical Exam  Gen: Well-appearing, in no acute distress; non-toxic CV: Regular Rate. Well-perfused. Warm.  Resp: Breathing unlabored on room air; no wheezing. Psych: Fluid speech in conversation; appropriate affect; normal thought process  Ortho Exam PHYSICAL EXAM:  General: Patient is well appearing and in no distress.   Skin and Muscle: No significant skin changes are  apparent to upper extremities.   Range of Motion and Palpation Tests: Mobility is full about the elbows with flexion and extension.  Forearm supination and pronation are 85/85 bilaterally.  Wrist  flexion/extension is 75/65 bilaterally.  Digital flexion and extension are full.  Thumb opposition is full to the base of the small fingers bilaterally.    No cords or nodules are palpated.  No triggering is observed.    Mild tenderness over the thumb CMC articulation bilaterally is observed. Scaphoid shift test is negative bilateral.  Finklestein test is negative bilaterally.  Ulnar impingement test is negative bilateral.  No evidence of radiocarpal, midcarpal or intercarpal joint instability with provocation bilaterally.  Neurologic, Vascular, Motor: Sensation is intact to light touch in the bilateral median nerve distribution.    Thenar atrophy: Negative bilaterally Tinel sign: Mildly positive bilateral carpal tunnel Carpal tunnel compression: Mildly positive bilateral carpal tunnel Phalen test: Positive bilateral  Motor bilateral hand FPL: 5/5 Index FDP: 5/5 APB: 5/5  Fingers pink and well perfused.  Capillary refill is brisk.     No results found for: HGBA1C   Imaging: No results found.  Past Medical/Family/Surgical/Social History: Medications & Allergies reviewed per EMR, new medications updated. Patient Active Problem List   Diagnosis Date Noted   Genetic testing 05/28/2023   Chronic joint pain 05/04/2022   Labor and delivery, indication for care 10/17/2019   Thoracic back pain 02/19/2016   Anxiety and depression 02/19/2016   Abdominal pain 02/19/2016   Past Medical History:  Diagnosis Date   Depression    Eating disorder    Gallstones    Insulin resistance    Family History  Problem Relation Age of Onset   Mental illness Mother    Lupus Mother    Fibromyalgia Mother    Hypothyroidism Sister    Arthritis Sister    Diabetes Maternal Grandmother    COPD Maternal Grandfather    Heart disease Maternal Grandfather    Ovarian cancer Paternal Grandmother 46   Healthy Daughter    Breast cancer Other    Lymphoma Other    Heart disease Other    Mental  illness Other    Diabetes Other    Ovarian cancer Other    Past Surgical History:  Procedure Laterality Date   CHOLECYSTECTOMY  2012   Social History   Occupational History   Not on file  Tobacco Use   Smoking status: Never    Passive exposure: Never   Smokeless tobacco: Never  Vaping Use   Vaping status: Never Used  Substance and Sexual Activity   Alcohol use: Yes    Comment: 1 monthly   Drug use: No   Sexual activity: Not on file    Jeyson Deshotel Afton Alderton, M.D. [facility], Hand Surgery

## 2024-09-01 ENCOUNTER — Encounter: Payer: Self-pay | Admitting: Radiology
